# Patient Record
Sex: Male | Born: 1980 | Race: White | Hispanic: No | Marital: Married | Smoking: Never smoker
Health system: Southern US, Community
[De-identification: ages and names within clinical notes are randomized; demographics above are authoritative.]

## PROBLEM LIST (undated history)

## (undated) DIAGNOSIS — E785 Hyperlipidemia, unspecified: Secondary | ICD-10-CM

## (undated) DIAGNOSIS — I10 Essential (primary) hypertension: Secondary | ICD-10-CM

## (undated) DIAGNOSIS — F419 Anxiety disorder, unspecified: Secondary | ICD-10-CM

## (undated) HISTORY — PX: WISDOM TOOTH EXTRACTION: SHX21

---

## 2010-09-20 ENCOUNTER — Ambulatory Visit
Admission: RE | Admit: 2010-09-20 | Discharge: 2010-09-20 | Disposition: A | Discharge status: Home / Self Care | Payer: No Typology Code available for payment source | Source: Ambulatory Visit | Attending: Occupational Medicine | Admitting: Occupational Medicine

## 2010-09-20 ENCOUNTER — Other Ambulatory Visit: Payer: Self-pay | Admitting: Occupational Medicine

## 2010-09-20 DIAGNOSIS — Z0000 Encounter for general adult medical examination without abnormal findings: Secondary | ICD-10-CM

## 2013-07-09 ENCOUNTER — Encounter: Payer: Self-pay | Admitting: Family Medicine

## 2013-07-09 ENCOUNTER — Ambulatory Visit (INDEPENDENT_AMBULATORY_CARE_PROVIDER_SITE_OTHER): Payer: 59 | Admitting: Family Medicine

## 2013-07-09 VITALS — BP 132/80 | Ht 70.0 in | Wt 199.0 lb

## 2013-07-09 DIAGNOSIS — Z0000 Encounter for general adult medical examination without abnormal findings: Secondary | ICD-10-CM

## 2013-07-09 DIAGNOSIS — Z23 Encounter for immunization: Secondary | ICD-10-CM

## 2013-07-09 NOTE — Progress Notes (Signed)
   Subjective:    Patient ID: Andre Ramirez, male    DOB: [birth year], 33 y.o.   MRN: [id number]  HPI Patient is here today to get a Tdap. He recently had a baby girl 2 weeks ago.  Pt had a tetanus with diptheria in 2012.  He states he does not get allergy issues. He wonders if he should get it T. gap at this point I told him it would be a good idea because of him having a young child. He might get some increased soreness related to getting a T. gap since he just had a tetanus shot 3 years ago but it should not be any serious trouble with it. No concerns Patient denies smoking drinking denies hospitalization no chronic health problems does not take any medicines he does try to take good care of himself he works in [employer]   Review of Systems He denies headaches chest pain shortness of breath denies any other health problems.    Objective:   Physical Exam Lungs are clear hearts regular pulse normal extremities no edema      Assessment & Plan:  Overall doing well patient does good job thing care of himself he feels he is up-to-date on his cholesterol. We will go ahead and do a T. dad. Patient without any other complications currently. Safety measures dietary measures reviewed patient already exercises on a regular basis

## 2013-08-30 ENCOUNTER — Telehealth: Payer: Self-pay | Admitting: Family Medicine

## 2013-08-30 NOTE — Telephone Encounter (Signed)
Patient dropped off form asking if he is physically fit to complete a physical fitness test to be signed by physician. He wants to know if we will be able to sign this for him. I told him we would call him back probably Monday to let him know if we are going to require an appointment.

## 2013-09-01 NOTE — Telephone Encounter (Signed)
This would require an office visit. Essentially this is us giving him clearance to do significant physical activity. At the minimum I need to see him listen to his heart and lungs and check his blood pressure and gather history from the patient. I can see him this week preferably Monday Tuesday or Wednesday

## 2013-09-02 NOTE — Telephone Encounter (Signed)
Pt states he had a phyiscal a couple of months ago. Last office visit 07/09/13. Was not listed as a physical but the note states he was here for a tdap and his heart and lungs normal. Does he need another visit.

## 2013-09-02 NOTE — Telephone Encounter (Signed)
[facility]

## 2013-09-02 NOTE — Telephone Encounter (Signed)
Notified patient form will be ready tomorrow morning.

## 2013-09-02 NOTE — Telephone Encounter (Signed)
No, I can fill out his form. The patient may pick it up by tomorrow morning

## 2017-07-17 ENCOUNTER — Ambulatory Visit: Payer: Federal, State, Local not specified - PPO | Admitting: Family Medicine

## 2017-07-17 VITALS — BP 124/86 | Ht 70.0 in | Wt 201.6 lb

## 2017-07-17 DIAGNOSIS — F411 Generalized anxiety disorder: Secondary | ICD-10-CM | POA: Diagnosis not present

## 2017-07-17 DIAGNOSIS — I10 Essential (primary) hypertension: Secondary | ICD-10-CM

## 2017-07-17 MED ORDER — SERTRALINE HCL 50 MG PO TABS: 50.0000 mg | ORAL_TABLET | Freq: Every day | ORAL | 3 refills | Status: DC

## 2017-07-17 NOTE — Patient Instructions (Signed)
DASH Eating Plan DASH stands for "Dietary Approaches to Stop Hypertension." The DASH eating plan is a healthy eating plan that has been shown to reduce high blood pressure (hypertension). It may also reduce your risk for type 2 diabetes, heart disease, and stroke. The DASH eating plan may also help with weight loss. What are tips for following this plan? General guidelines  Avoid eating more than 2,300 mg (milligrams) of salt (sodium) a day. If you have hypertension, you may need to reduce your sodium intake to 1,500 mg a day.  Limit alcohol intake to no more than 1 drink a day for nonpregnant women and 2 drinks a day for men. One drink equals 12 oz of beer, 5 oz of wine, or 1 oz of hard liquor.  Work with your health care provider to maintain a healthy body weight or to lose weight. Ask what an ideal weight is for you.  Get at least 30 minutes of exercise that causes your heart to beat faster (aerobic exercise) most days of the week. Activities may include walking, swimming, or biking.  Work with your health care provider or diet and nutrition specialist (dietitian) to adjust your eating plan to your individual calorie needs. Reading food labels  Check food labels for the amount of sodium per serving. Choose foods with less than 5 percent of the Daily Value of sodium. Generally, foods with less than 300 mg of sodium per serving fit into this eating plan.  To find whole grains, look for the word "whole" as the first word in the ingredient list. Shopping  Buy products labeled as "low-sodium" or "no salt added."  Buy fresh foods. Avoid canned foods and premade or frozen meals. Cooking  Avoid adding salt when cooking. Use salt-free seasonings or herbs instead of table salt or sea salt. Check with your health care provider or pharmacist before using salt substitutes.  Do not fry foods. Cook foods using healthy methods such as baking, boiling, grilling, and broiling instead.  Cook with  heart-healthy oils, such as olive, canola, soybean, or sunflower oil. Meal planning   Eat a balanced diet that includes: ? 5 or more servings of fruits and vegetables each day. At each meal, try to fill half of your plate with fruits and vegetables. ? Up to 6-8 servings of whole grains each day. ? Less than 6 oz of lean meat, poultry, or fish each day. A 3-oz serving of meat is about the same size as a deck of cards. One egg equals 1 oz. ? 2 servings of low-fat dairy each day. ? A serving of nuts, seeds, or beans 5 times each week. ? Heart-healthy fats. Healthy fats called Omega-3 fatty acids are found in foods such as flaxseeds and coldwater fish, like sardines, salmon, and mackerel.  Limit how much you eat of the following: ? Canned or prepackaged foods. ? Food that is high in trans fat, such as fried foods. ? Food that is high in saturated fat, such as fatty meat. ? Sweets, desserts, sugary drinks, and other foods with added sugar. ? Full-fat dairy products.  Do not salt foods before eating.  Try to eat at least 2 vegetarian meals each week.  Eat more home-cooked food and less restaurant, buffet, and fast food.  When eating at a restaurant, ask that your food be prepared with less salt or no salt, if possible. What foods are recommended? The items listed may not be a complete list. Talk with your dietitian about what   dietary choices are best for you. Grains Whole-grain or whole-wheat bread. Whole-grain or whole-wheat pasta. Brown rice. Oatmeal. Quinoa. Bulgur. Whole-grain and low-sodium cereals. Pita bread. Low-fat, low-sodium crackers. Whole-wheat flour tortillas. Vegetables Fresh or frozen vegetables (raw, steamed, roasted, or grilled). Low-sodium or reduced-sodium tomato and vegetable juice. Low-sodium or reduced-sodium tomato sauce and tomato paste. Low-sodium or reduced-sodium canned vegetables. Fruits All fresh, dried, or frozen fruit. Canned fruit in natural juice (without  added sugar). Meat and other protein foods Skinless chicken or turkey. Ground chicken or turkey. Pork with fat trimmed off. Fish and seafood. Egg whites. Dried beans, peas, or lentils. Unsalted nuts, nut butters, and seeds. Unsalted canned beans. Lean cuts of beef with fat trimmed off. Low-sodium, lean deli meat. Dairy Low-fat (1%) or fat-free (skim) milk. Fat-free, low-fat, or reduced-fat cheeses. Nonfat, low-sodium ricotta or cottage cheese. Low-fat or nonfat yogurt. Low-fat, low-sodium cheese. Fats and oils Soft margarine without trans fats. Vegetable oil. Low-fat, reduced-fat, or light mayonnaise and salad dressings (reduced-sodium). Canola, safflower, olive, soybean, and sunflower oils. Avocado. Seasoning and other foods Herbs. Spices. Seasoning mixes without salt. Unsalted popcorn and pretzels. Fat-free sweets. What foods are not recommended? The items listed may not be a complete list. Talk with your dietitian about what dietary choices are best for you. Grains Baked goods made with fat, such as croissants, muffins, or some breads. Dry pasta or rice meal packs. Vegetables Creamed or fried vegetables. Vegetables in a cheese sauce. Regular canned vegetables (not low-sodium or reduced-sodium). Regular canned tomato sauce and paste (not low-sodium or reduced-sodium). Regular tomato and vegetable juice (not low-sodium or reduced-sodium). Pickles. Olives. Fruits Canned fruit in a light or heavy syrup. Fried fruit. Fruit in cream or butter sauce. Meat and other protein foods Fatty cuts of meat. Ribs. Fried meat. Bacon. Sausage. Bologna and other processed lunch meats. Salami. Fatback. Hotdogs. Bratwurst. Salted nuts and seeds. Canned beans with added salt. Canned or smoked fish. Whole eggs or egg yolks. Chicken or turkey with skin. Dairy Whole or 2% milk, cream, and half-and-half. Whole or full-fat cream cheese. Whole-fat or sweetened yogurt. Full-fat cheese. Nondairy creamers. Whipped toppings.  Processed cheese and cheese spreads. Fats and oils Butter. Stick margarine. Lard. Shortening. Ghee. Bacon fat. Tropical oils, such as coconut, palm kernel, or palm oil. Seasoning and other foods Salted popcorn and pretzels. Onion salt, garlic salt, seasoned salt, table salt, and sea salt. Worcestershire sauce. Tartar sauce. Barbecue sauce. Teriyaki sauce. Soy sauce, including reduced-sodium. Steak sauce. Canned and packaged gravies. Fish sauce. Oyster sauce. Cocktail sauce. Horseradish that you find on the shelf. Ketchup. Mustard. Meat flavorings and tenderizers. Bouillon cubes. Hot sauce and Tabasco sauce. Premade or packaged marinades. Premade or packaged taco seasonings. Relishes. Regular salad dressings. Where to find more information:  National Heart, Lung, and Blood Institute: www.nhlbi.nih.gov  American Heart Association: www.heart.org Summary  The DASH eating plan is a healthy eating plan that has been shown to reduce high blood pressure (hypertension). It may also reduce your risk for type 2 diabetes, heart disease, and stroke.  With the DASH eating plan, you should limit salt (sodium) intake to 2,300 mg a day. If you have hypertension, you may need to reduce your sodium intake to 1,500 mg a day.  When on the DASH eating plan, aim to eat more fresh fruits and vegetables, whole grains, lean proteins, low-fat dairy, and heart-healthy fats.  Work with your health care provider or diet and nutrition specialist (dietitian) to adjust your eating plan to your individual   calorie needs. This information is not intended to replace advice given to you by your health care provider. Make sure you discuss any questions you have with your health care provider. Document Released: 04/07/2011 Document Revised: 04/11/2016 Document Reviewed: 04/11/2016 Elsevier Interactive Patient Education  2018 Elsevier Inc.  

## 2017-07-17 NOTE — Progress Notes (Signed)
Subjective:    Patient ID: Andre Ramirez, male    DOB: 03-27-1981, 37 y.o.   MRN: [id number]  Anxiety  Presents for initial visit. Onset was 1 to 4 weeks ago. Symptoms include nervous/anxious behavior. Patient reports no chest pain, confusion, nausea or shortness of breath. Primary symptoms comment: sweaty hands/feet; scared of fainting again. job related . The quality of sleep is good.   Risk factors include prior traumatic experience.  Patient denies any depression symptoms States overall health good Does running at least 4 days a week lift weights several days a week eats healthy Denies being suicidal Patient currently working in [place] Was working in [employer] living up in [place] but will be moving down to join him soon Patient relates since having a traumatic event at a urgent care center in [facility] he has had significant anxiety issues patient relates he had a viral-like illness he was evaluated  They thought he had sepsis he was doing the respiratory exam he hyperventilated passed out and then the urgent care center called an ambulance he went and had extensive testing at the ER was released but ever since then he has been having anxiety spells anxiousness sweating spells he states it was a very negative experience moderately traumatic as well  Traumatic experience at urgent care in [place]. Job related stress also. No drugs or alcohol use.   Review of Systems  Constitutional: Negative for activity change.  HENT: Negative for congestion and rhinorrhea.   Respiratory: Negative for cough and shortness of breath.   Cardiovascular: Negative for chest pain.  Gastrointestinal: Negative for abdominal pain, diarrhea, nausea and vomiting.  Genitourinary: Negative for dysuria and hematuria.  Neurological: Negative for weakness and headaches.  Psychiatric/Behavioral: Negative for confusion. The patient is nervous/anxious.        Objective:   Physical Exam    Constitutional: He appears well-nourished. No distress.  HENT:  Head: Normocephalic and atraumatic.  Eyes: Right eye exhibits no discharge. Left eye exhibits no discharge.  Neck: No tracheal deviation present.  Cardiovascular: Normal rate, regular rhythm and normal heart sounds.  No murmur heard. Pulmonary/Chest: Effort normal and breath sounds normal. No respiratory distress. He has no wheezes.  Musculoskeletal: He exhibits no edema.  Lymphadenopathy:    He has no cervical adenopathy.  Neurological: He is alert.  Skin: Skin is warm. No rash noted.  Psychiatric: His behavior is normal.  Vitals reviewed.  Approximately 35-40 minutes was spent with the patient discussing his traumatic episode discussing his symptomatology the testing that they did up in [place] plus also his anxiety related symptoms he has a lot of worry and concern and anxiousness and almost panic-like sensation therefore would benefit from further intervention       Assessment & Plan:  Hypertension good control continue current medications follow-up in 6 months  Generalized anxiety disorder Long discussion held with patient given his options of counseling versus medication and counseling patient not suicidal patient opts for counseling and medication we did discuss Zoloft and side effects of Zoloft and he is perfectly fine with trying this we will start off with 50 mg daily for the first several weeks he will give [place] feedback how he is doing then we will decide if we need to increase a dose of the medicine if he has side effects of negative portion he can let us know and we will have to switch to a different medication or different approach to avoid benzodiazepine  Patient will  do a follow-up visit within a couple weeks plus he will also communicate to us through my chart as necessary or call as necessary

## 2017-07-18 ENCOUNTER — Encounter: Payer: Self-pay | Admitting: Family Medicine

## 2017-07-24 ENCOUNTER — Encounter: Payer: Self-pay | Admitting: Family Medicine

## 2017-07-24 NOTE — Telephone Encounter (Signed)
Nurses-I agree patient needs to stay off of Zoloft. Also please assist referral to [facility] [place]- if pt needs follow up with me next week that would be fine but what he needs most is psychiatry consult- please make sure that Brendal and yall send this as a urgent consult and he neesd more than counseling he needs psycjhiatry consult for medications (also if pt suicidal emergency consult behav eval via ER)

## 2017-07-25 ENCOUNTER — Other Ambulatory Visit: Payer: Self-pay

## 2017-07-25 DIAGNOSIS — F411 Generalized anxiety disorder: Secondary | ICD-10-CM

## 2017-07-25 NOTE — Telephone Encounter (Signed)
-  Crystal-Thank you for your ongoing assistance.  Please discuss with Enid DerryBrendale if doing the appointment through [facility] would speed things up?  If so please give the patient the proper phone number for [facility] to see if they can see him sooner

## 2017-07-25 NOTE — Telephone Encounter (Signed)
Patient is aware of all. I called Behavioral health and spoke with Lupita LeashDonna they state they will have to review the patient's records(records were sent to them) and will call pt with an appointment. Which may be the end of May as Dr.Hisada will be out of town and she is the only one whom sees adults.(She does not have anyone whom covers for her) I let the pt know this and he is aware that if he needs anything to call our office in the mean time.Also if gets desperate to go to the Ed.

## 2017-07-25 NOTE — Telephone Encounter (Signed)
Nurses-please see addendum

## 2017-07-26 NOTE — Telephone Encounter (Signed)
I called the [facility] and spoke with Rosey Batheresa she states they did not have any available appt until May 10,2019. They have put him down for this appt and placed on a wait list. I called the pt to notify of this and he states he will be out of town starting next week until July 2019. I reitterated what I told him yesterday if needs anyting for now call us or go to the ed for evaluation if desparate.I called the [facility] and left a message on the vm to cancel the appt,asked that they r/c here to the office.

## 2017-07-31 ENCOUNTER — Encounter: Payer: Self-pay | Admitting: Family Medicine

## 2017-07-31 ENCOUNTER — Ambulatory Visit: Payer: Federal, State, Local not specified - PPO | Admitting: Family Medicine

## 2017-07-31 VITALS — BP 118/68 | Ht 70.0 in | Wt 198.0 lb

## 2017-07-31 DIAGNOSIS — F439 Reaction to severe stress, unspecified: Secondary | ICD-10-CM

## 2017-07-31 DIAGNOSIS — I10 Essential (primary) hypertension: Secondary | ICD-10-CM

## 2017-07-31 DIAGNOSIS — F411 Generalized anxiety disorder: Secondary | ICD-10-CM | POA: Diagnosis not present

## 2017-07-31 MED ORDER — LISINOPRIL 10 MG PO TABS: 10.0000 mg | ORAL_TABLET | Freq: Every day | ORAL | 1 refills | Status: DC

## 2017-07-31 NOTE — Progress Notes (Signed)
   Subjective:    Patient ID: Andre Ramirez, male    DOB: 1981/01/10, 37 y.o.   MRN: [id number]  HPI Patient is here today to follow up on Htn.He is taking Lisinopril 10 mg daily. Patient for blood pressure check up. Patient relates compliance with meds. Todays BP reviewed with the patient. Patient denies issues with medication. Patient relates reasonable diet. Patient tries to minimize salt. Patient aware of BP goals.    He states he was started on Zoloft 50 mg was having drowiness, diarrhea, more anxious.He stopped it on his own.Since off of it he states he feels better. Patient states he had severe side effects with this made him feel worse.  He did stop the medicine he is now starting to feel back like his usual self Still has some anxiety related issues he states he is putting them into perspective  Patient went through a very traumatic episode at a urgent care center and was sent to the ER for further workup which was negative  Review of Systems  Constitutional: Negative for activity change.  HENT: Negative for congestion and rhinorrhea.   Respiratory: Negative for cough and shortness of breath.   Cardiovascular: Negative for chest pain.  Gastrointestinal: Negative for abdominal pain, diarrhea, nausea and vomiting.  Genitourinary: Negative for dysuria and hematuria.  Neurological: Negative for weakness and headaches.  Psychiatric/Behavioral: Negative for confusion.       Objective:   Physical Exam  Constitutional: He appears well-nourished. No distress.  HENT:  Head: Normocephalic and atraumatic.  Eyes: Right eye exhibits no discharge. Left eye exhibits no discharge.  Neck: No tracheal deviation present.  Cardiovascular: Normal rate, regular rhythm and normal heart sounds.  No murmur heard. Pulmonary/Chest: Effort normal and breath sounds normal. No respiratory distress. He has no wheezes.  Musculoskeletal: He exhibits no edema.  Lymphadenopathy:    He has no cervical  adenopathy.  Neurological: He is alert.  Skin: Skin is warm. No rash noted.  Psychiatric: His behavior is normal.  Vitals reviewed.         Assessment & Plan:  Patient did sign for records to be sent I have not received them at this point  Blood pressure good control continue current measures  Patient under a little bit of stress because his family is not with him currently but hopefully after his training they will be able to be with him  Moderate anxiety symptoms not clinically depressed no need for medication recommend treatment with mindfulness and meditation follow-up if ongoing troubles patient unfortunately cannot be seen by psychology for several weeks He will be in [place] for the next 4 months for training with [employer] he will send us an update in several weeks time how he is doing

## 2017-09-04 ENCOUNTER — Encounter: Payer: Self-pay | Admitting: Family Medicine

## 2017-09-05 ENCOUNTER — Telehealth: Payer: Self-pay | Admitting: Family Medicine

## 2017-09-05 NOTE — Telephone Encounter (Signed)
Velma from [facility] returned call and was informed to cancel patients appt.

## 2017-09-05 NOTE — Telephone Encounter (Signed)
I have called and left a message on vm [phone] to r/c to our office as I needed to cx and appointment for a pt.

## 2017-09-05 NOTE — Telephone Encounter (Signed)
Please cancel his appointment with behavioral health.  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  No need to reschedule at this point.

## 2017-09-08 ENCOUNTER — Encounter

## 2017-09-08 ENCOUNTER — Ambulatory Visit (HOSPITAL_COMMUNITY): Payer: Self-pay | Admitting: Psychiatry

## 2017-09-14 ENCOUNTER — Telehealth: Payer: Self-pay | Admitting: Family Medicine

## 2017-09-14 NOTE — Telephone Encounter (Signed)
This patient is currently in [place].  He is in training.  Please let the patient know that I received copies of tests which were completed in [place] when he was in [place].  Overall all of this looks good.  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  This is not worrisome but will need follow-up.  In many cases this finding may not be accurate so therefore often we have to do a follow-up ultrasound.  No need to do any testing right at this moment.  Therefore I recommend that he do a follow-up later this summer.  It is not necessary for this patient to stop his training.  This is not an emergent issue.  But it does need follow-up so therefore I want to make sure he is aware to do a follow-up office visit after his training later this summer.  We will discuss this further at that time.  Eating healthy and staying with regular physical activity is all he needs to do.  If the patient is worried about this we will be happy to see him sooner if he is not in [place].

## 2017-09-15 NOTE — Telephone Encounter (Signed)
Left message to return call 

## 2017-09-15 NOTE — Telephone Encounter (Signed)
Discussed with pt. Pt verbalized understanding. He states he will call back to schedule the follow up end of summer with dr scott.

## 2017-12-11 ENCOUNTER — Telehealth: Payer: Self-pay | Admitting: Family Medicine

## 2017-12-11 NOTE — Telephone Encounter (Signed)
Last labs scanned in Met 7, CBC and TSH 05/2017

## 2017-12-11 NOTE — Telephone Encounter (Signed)
Patient has wellness on 9/11 and requesting labs he wanted to know if he had to fast also.

## 2017-12-11 NOTE — Telephone Encounter (Signed)
I recommend metabolic 7, lipid, liver Also Centers for Disease Control recommends that all adults do a one-time HIV antibody test as screening purposes

## 2017-12-12 ENCOUNTER — Other Ambulatory Visit: Payer: Self-pay | Admitting: Family Medicine

## 2017-12-12 DIAGNOSIS — Z114 Encounter for screening for human immunodeficiency virus [HIV]: Secondary | ICD-10-CM

## 2017-12-12 DIAGNOSIS — Z79899 Other long term (current) drug therapy: Secondary | ICD-10-CM

## 2017-12-12 DIAGNOSIS — Z0000 Encounter for general adult medical examination without abnormal findings: Secondary | ICD-10-CM

## 2017-12-12 DIAGNOSIS — Z13220 Encounter for screening for lipoid disorders: Secondary | ICD-10-CM

## 2017-12-12 DIAGNOSIS — I10 Essential (primary) hypertension: Secondary | ICD-10-CM

## 2017-12-12 NOTE — Telephone Encounter (Signed)
Labs ordered and pt notified

## 2018-01-04 ENCOUNTER — Encounter: Payer: Self-pay | Admitting: Family Medicine

## 2018-01-05 DIAGNOSIS — Z79899 Other long term (current) drug therapy: Secondary | ICD-10-CM | POA: Diagnosis not present

## 2018-01-05 DIAGNOSIS — Z0000 Encounter for general adult medical examination without abnormal findings: Secondary | ICD-10-CM | POA: Diagnosis not present

## 2018-01-05 DIAGNOSIS — I10 Essential (primary) hypertension: Secondary | ICD-10-CM | POA: Diagnosis not present

## 2018-01-05 DIAGNOSIS — Z13220 Encounter for screening for lipoid disorders: Secondary | ICD-10-CM | POA: Diagnosis not present

## 2018-01-05 DIAGNOSIS — Z114 Encounter for screening for human immunodeficiency virus [HIV]: Secondary | ICD-10-CM | POA: Diagnosis not present

## 2018-01-06 LAB — BASIC METABOLIC PANEL
BUN/Creatinine Ratio: 12 (ref 9–20)
BUN: 15 mg/dL (ref 6–20)
CALCIUM: 9.7 mg/dL (ref 8.7–10.2)
CO2: 19 mmol/L — AB (ref 20–29)
CREATININE: 1.23 mg/dL (ref 0.76–1.27)
Chloride: 103 mmol/L (ref 96–106)
GFR, EST AFRICAN AMERICAN: 86 mL/min/{1.73_m2} (ref >59)
GFR, EST NON AFRICAN AMERICAN: 75 mL/min/{1.73_m2} (ref >59)
Glucose: 91 mg/dL (ref 65–99)
Potassium: 4.3 mmol/L (ref 3.5–5.2)
Sodium: 141 mmol/L (ref 134–144)

## 2018-01-06 LAB — LIPID PANEL
CHOL/HDL RATIO: 2.9 ratio (ref 0.0–5.0)
Cholesterol, Total: 174 mg/dL (ref 100–199)
HDL: 60 mg/dL (ref >39)
LDL Calculated: 103 mg/dL — ABNORMAL HIGH (ref 0–99)
Triglycerides: 54 mg/dL (ref 0–149)
VLDL CHOLESTEROL CAL: 11 mg/dL (ref 5–40)

## 2018-01-06 LAB — HEPATIC FUNCTION PANEL
ALBUMIN: 5.1 g/dL (ref 3.5–5.5)
ALT: 31 IU/L (ref 0–44)
AST: 24 IU/L (ref 0–40)
Alkaline Phosphatase: 72 IU/L (ref 39–117)
BILIRUBIN TOTAL: 0.5 mg/dL (ref 0.0–1.2)
BILIRUBIN, DIRECT: 0.14 mg/dL (ref 0.00–0.40)
Total Protein: 7.2 g/dL (ref 6.0–8.5)

## 2018-01-06 LAB — HIV ANTIBODY (ROUTINE TESTING W REFLEX): HIV Screen 4th Generation wRfx: NONREACTIVE

## 2018-01-10 ENCOUNTER — Encounter: Payer: Self-pay | Admitting: Family Medicine

## 2018-01-10 ENCOUNTER — Ambulatory Visit (INDEPENDENT_AMBULATORY_CARE_PROVIDER_SITE_OTHER): Payer: Federal, State, Local not specified - PPO | Admitting: Family Medicine

## 2018-01-10 VITALS — BP 134/86 | HR 88 | Ht 70.0 in | Wt 209.0 lb

## 2018-01-10 DIAGNOSIS — Z0000 Encounter for general adult medical examination without abnormal findings: Secondary | ICD-10-CM | POA: Diagnosis not present

## 2018-01-10 DIAGNOSIS — I6529 Occlusion and stenosis of unspecified carotid artery: Secondary | ICD-10-CM

## 2018-01-10 DIAGNOSIS — I10 Essential (primary) hypertension: Secondary | ICD-10-CM

## 2018-01-10 MED ORDER — LISINOPRIL 20 MG PO TABS: 20.0000 mg | ORAL_TABLET | Freq: Every day | ORAL | 1 refills | Status: DC

## 2018-01-10 NOTE — Progress Notes (Signed)
   Subjective:    Patient ID: Andre Ramirez, male    DOB: January 04, 1981, 37 y.o.   MRN: [id number]  HPI The patient comes in today for a wellness visit.    A review of their health history was completed.  A review of medications was also completed.  Any needed refills; lisinopril  Eating habits: health conscious  Falls/  MVA accidents in past few months: none  Regular exercise: yes running and weight lifting  Specialist pt sees on regular basis: none  Preventative health issues were discussed.   Additional concerns: none  Patient does exercise does not smoke drinks occasionally denies high fevers health issues headaches.  Denies chest pain.  Blood pressure at times runs up but I am not sure the accuracy of his blood pressure cuff  Review of Systems  Constitutional: Negative for diaphoresis and fatigue.  HENT: Negative for congestion and rhinorrhea.   Respiratory: Negative for cough and shortness of breath.   Cardiovascular: Negative for chest pain and leg swelling.  Gastrointestinal: Negative for abdominal pain and diarrhea.  Skin: Negative for color change and rash.  Neurological: Negative for dizziness and headaches.  Psychiatric/Behavioral: Negative for behavioral problems and confusion.       Objective:   Physical Exam  Constitutional: He appears well-nourished. No distress.  HENT:  Head: Normocephalic and atraumatic.  Eyes: Right eye exhibits no discharge. Left eye exhibits no discharge.  Neck: No tracheal deviation present.  Cardiovascular: Normal rate, regular rhythm and normal heart sounds.  No murmur heard. Pulmonary/Chest: Effort normal and breath sounds normal. No respiratory distress.  Musculoskeletal: He exhibits no edema.  Lymphadenopathy:    He has no cervical adenopathy.  Neurological: He is alert. Coordination normal.  Skin: Skin is warm and dry.  Psychiatric: He has a normal mood and affect. His behavior is normal.  Vitals reviewed.   Blood  pressure checked multiple times under fair control l-patient is a young patient would benefit from having blood pressure low or we will double up on the lisinopril patient should follow-up with him 4 to 6 weeks bring his blood pressure cuff eat healthy diet stay physically active goal is to get blood pressure approximately 130/80 if possible patient does get stressed about blood pressure checks it too frequently I encouraged him to back off on the frequency of checking it.     Assessment & Plan:  Very nice gentleman  Adult wellness-complete.wellness physical was conducted today. Importance of diet and exercise were discussed in detail.  In addition to this a discussion regarding safety was also covered. We also reviewed over immunizations and gave recommendations regarding current immunization needed for age.  In addition to this additional areas were also touched on including: Preventative health exams needed:  Colonoscopy not indicated  Patient was advised yearly wellness exam  Patient on ultrasound from earlier this spring in [place] had a test that showed 50% carotid blockage I believe that this is a false abnormal result but I did also believe that the patient needs to have another carotid ultrasound to verify it I am not certain if they will allow [place] to do the ultrasound now or if the insurance will make his weight 1 year-we will look into this

## 2018-01-10 NOTE — Patient Instructions (Signed)
DASH Eating Plan DASH stands for "Dietary Approaches to Stop Hypertension." The DASH eating plan is a healthy eating plan that has been shown to reduce high blood pressure (hypertension). It may also reduce your risk for type 2 diabetes, heart disease, and stroke. The DASH eating plan may also help with weight loss. What are tips for following this plan? General guidelines  Avoid eating more than 2,300 mg (milligrams) of salt (sodium) a day. If you have hypertension, you may need to reduce your sodium intake to 1,500 mg a day.  Limit alcohol intake to no more than 1 drink a day for nonpregnant women and 2 drinks a day for men. One drink equals 12 oz of beer, 5 oz of wine, or 1 oz of hard liquor.  Work with your health care provider to maintain a healthy body weight or to lose weight. Ask what an ideal weight is for you.  Get at least 30 minutes of exercise that causes your heart to beat faster (aerobic exercise) most days of the week. Activities may include walking, swimming, or biking.  Work with your health care provider or diet and nutrition specialist (dietitian) to adjust your eating plan to your individual calorie needs. Reading food labels  Check food labels for the amount of sodium per serving. Choose foods with less than 5 percent of the Daily Value of sodium. Generally, foods with less than 300 mg of sodium per serving fit into this eating plan.  To find whole grains, look for the word "whole" as the first word in the ingredient list. Shopping  Buy products labeled as "low-sodium" or "no salt added."  Buy fresh foods. Avoid canned foods and premade or frozen meals. Cooking  Avoid adding salt when cooking. Use salt-free seasonings or herbs instead of table salt or sea salt. Check with your health care provider or pharmacist before using salt substitutes.  Do not fry foods. Cook foods using healthy methods such as baking, boiling, grilling, and broiling instead.  Cook with  heart-healthy oils, such as olive, canola, soybean, or sunflower oil. Meal planning   Eat a balanced diet that includes: ? 5 or more servings of fruits and vegetables each day. At each meal, try to fill half of your plate with fruits and vegetables. ? Up to 6-8 servings of whole grains each day. ? Less than 6 oz of lean meat, poultry, or fish each day. A 3-oz serving of meat is about the same size as a deck of cards. One egg equals 1 oz. ? 2 servings of low-fat dairy each day. ? A serving of nuts, seeds, or beans 5 times each week. ? Heart-healthy fats. Healthy fats called Omega-3 fatty acids are found in foods such as flaxseeds and coldwater fish, like sardines, salmon, and mackerel.  Limit how much you eat of the following: ? Canned or prepackaged foods. ? Food that is high in trans fat, such as fried foods. ? Food that is high in saturated fat, such as fatty meat. ? Sweets, desserts, sugary drinks, and other foods with added sugar. ? Full-fat dairy products.  Do not salt foods before eating.  Try to eat at least 2 vegetarian meals each week.  Eat more home-cooked food and less restaurant, buffet, and fast food.  When eating at a restaurant, ask that your food be prepared with less salt or no salt, if possible. What foods are recommended? The items listed may not be a complete list. Talk with your dietitian about what   dietary choices are best for you. Grains Whole-grain or whole-wheat bread. Whole-grain or whole-wheat pasta. Brown rice. Oatmeal. Quinoa. Bulgur. Whole-grain and low-sodium cereals. Pita bread. Low-fat, low-sodium crackers. Whole-wheat flour tortillas. Vegetables Fresh or frozen vegetables (raw, steamed, roasted, or grilled). Low-sodium or reduced-sodium tomato and vegetable juice. Low-sodium or reduced-sodium tomato sauce and tomato paste. Low-sodium or reduced-sodium canned vegetables. Fruits All fresh, dried, or frozen fruit. Canned fruit in natural juice (without  added sugar). Meat and other protein foods Skinless chicken or turkey. Ground chicken or turkey. Pork with fat trimmed off. Fish and seafood. Egg whites. Dried beans, peas, or lentils. Unsalted nuts, nut butters, and seeds. Unsalted canned beans. Lean cuts of beef with fat trimmed off. Low-sodium, lean deli meat. Dairy Low-fat (1%) or fat-free (skim) milk. Fat-free, low-fat, or reduced-fat cheeses. Nonfat, low-sodium ricotta or cottage cheese. Low-fat or nonfat yogurt. Low-fat, low-sodium cheese. Fats and oils Soft margarine without trans fats. Vegetable oil. Low-fat, reduced-fat, or light mayonnaise and salad dressings (reduced-sodium). Canola, safflower, olive, soybean, and sunflower oils. Avocado. Seasoning and other foods Herbs. Spices. Seasoning mixes without salt. Unsalted popcorn and pretzels. Fat-free sweets. What foods are not recommended? The items listed may not be a complete list. Talk with your dietitian about what dietary choices are best for you. Grains Baked goods made with fat, such as croissants, muffins, or some breads. Dry pasta or rice meal packs. Vegetables Creamed or fried vegetables. Vegetables in a cheese sauce. Regular canned vegetables (not low-sodium or reduced-sodium). Regular canned tomato sauce and paste (not low-sodium or reduced-sodium). Regular tomato and vegetable juice (not low-sodium or reduced-sodium). Pickles. Olives. Fruits Canned fruit in a light or heavy syrup. Fried fruit. Fruit in cream or butter sauce. Meat and other protein foods Fatty cuts of meat. Ribs. Fried meat. Bacon. Sausage. Bologna and other processed lunch meats. Salami. Fatback. Hotdogs. Bratwurst. Salted nuts and seeds. Canned beans with added salt. Canned or smoked fish. Whole eggs or egg yolks. Chicken or turkey with skin. Dairy Whole or 2% milk, cream, and half-and-half. Whole or full-fat cream cheese. Whole-fat or sweetened yogurt. Full-fat cheese. Nondairy creamers. Whipped toppings.  Processed cheese and cheese spreads. Fats and oils Butter. Stick margarine. Lard. Shortening. Ghee. Bacon fat. Tropical oils, such as coconut, palm kernel, or palm oil. Seasoning and other foods Salted popcorn and pretzels. Onion salt, garlic salt, seasoned salt, table salt, and sea salt. Worcestershire sauce. Tartar sauce. Barbecue sauce. Teriyaki sauce. Soy sauce, including reduced-sodium. Steak sauce. Canned and packaged gravies. Fish sauce. Oyster sauce. Cocktail sauce. Horseradish that you find on the shelf. Ketchup. Mustard. Meat flavorings and tenderizers. Bouillon cubes. Hot sauce and Tabasco sauce. Premade or packaged marinades. Premade or packaged taco seasonings. Relishes. Regular salad dressings. Where to find more information:  National Heart, Lung, and Blood Institute: www.nhlbi.nih.gov  American Heart Association: www.heart.org Summary  The DASH eating plan is a healthy eating plan that has been shown to reduce high blood pressure (hypertension). It may also reduce your risk for type 2 diabetes, heart disease, and stroke.  With the DASH eating plan, you should limit salt (sodium) intake to 2,300 mg a day. If you have hypertension, you may need to reduce your sodium intake to 1,500 mg a day.  When on the DASH eating plan, aim to eat more fresh fruits and vegetables, whole grains, lean proteins, low-fat dairy, and heart-healthy fats.  Work with your health care provider or diet and nutrition specialist (dietitian) to adjust your eating plan to your individual   calorie needs. This information is not intended to replace advice given to you by your health care provider. Make sure you discuss any questions you have with your health care provider. Document Released: 04/07/2011 Document Revised: 04/11/2016 Document Reviewed: 04/11/2016 Elsevier Interactive Patient Education  2018 Elsevier Inc.  

## 2018-02-26 ENCOUNTER — Ambulatory Visit: Payer: Federal, State, Local not specified - PPO | Admitting: Family Medicine

## 2018-02-26 ENCOUNTER — Encounter: Payer: Self-pay | Admitting: Family Medicine

## 2018-02-26 VITALS — BP 132/74 | Ht 70.0 in | Wt 208.8 lb

## 2018-02-26 DIAGNOSIS — Z23 Encounter for immunization: Secondary | ICD-10-CM

## 2018-02-26 DIAGNOSIS — I10 Essential (primary) hypertension: Secondary | ICD-10-CM

## 2018-02-26 DIAGNOSIS — I6529 Occlusion and stenosis of unspecified carotid artery: Secondary | ICD-10-CM

## 2018-02-26 NOTE — Progress Notes (Signed)
   Subjective:    Patient ID: Andre Ramirez, male    DOB: [birth year], 37 y.o.   MRN: [id number]  Hypertension  The current episode started more than 1 month ago. Pertinent negatives include no chest pain. There are no compliance problems.    Pt here today for 6 week recheck. When taking at home his BP is in the 130/80s. Pt takes his BP on his weekends mostly but sometimes he does take it during the week. Pt takes his BP after he takes his medication.    Patient does try to stay healthy he is watching diet  Patient did have ultrasound of carotids approximately 6 months ago which showed 50% blockage on the right side which is surprising it is quite possible this test had potential air it was done through urgent care center and left that was associated with a hospital in [place] Review of Systems  Constitutional: Negative for activity change, chills and fever.  HENT: Positive for congestion and rhinorrhea. Negative for ear pain.   Eyes: Negative for discharge.  Respiratory: Positive for cough. Negative for wheezing.   Cardiovascular: Negative for chest pain.  Gastrointestinal: Negative for nausea and vomiting.  Musculoskeletal: Negative for arthralgias.       Objective:   Physical Exam  Constitutional: He appears well-developed.  HENT:  Head: Normocephalic.  Mouth/Throat: Oropharynx is clear and moist. No oropharyngeal exudate.  Neck: Normal range of motion.  Cardiovascular: Normal rate, regular rhythm and normal heart sounds.  No murmur heard. Pulmonary/Chest: Effort normal and breath sounds normal. He has no wheezes.  Lymphadenopathy:    He has no cervical adenopathy.  Neurological: He exhibits normal muscle tone.  Skin: Skin is warm and dry.  Nursing note and vitals reviewed.         Assessment & Plan:  Blood pressure good control continue current measures eat healthy stay active  Follow-up in 6 months  Patient had mild buildup of carotid artery diagnosed  approximately 7 months ago unlikely to be accurate we are going to check to see if his insurance will cover for carotid ultrasound.

## 2018-03-08 ENCOUNTER — Other Ambulatory Visit: Payer: Self-pay | Admitting: Family Medicine

## 2018-03-08 ENCOUNTER — Telehealth: Payer: Self-pay | Admitting: Family Medicine

## 2018-03-08 DIAGNOSIS — I25119 Atherosclerotic heart disease of native coronary artery with unspecified angina pectoris: Secondary | ICD-10-CM

## 2018-03-08 NOTE — Telephone Encounter (Signed)
Nurses Please set up carotid ultrasound Reason for this is carotid artery disease It is not emergent this can be scheduled as per the patient's convenience and hospital schedule thank you  Please let the patient know that we did check with his insurance company and they stated that they would allow him to have another carotid ultrasound

## 2018-03-08 NOTE — Telephone Encounter (Signed)
Patient is aware 

## 2018-03-08 NOTE — Telephone Encounter (Signed)
Contacted patient. Pt states any time will be fine. [facility] hospital is fine. He would like this sooner rather than later. Ultrasound scheduled for Monday November 11 at 2:30 pm; arrive at 2:15 for registration. Left message to return call

## 2018-03-12 ENCOUNTER — Ambulatory Visit (HOSPITAL_COMMUNITY)
Admission: RE | Admit: 2018-03-12 | Discharge: 2018-03-12 | Disposition: A | Discharge status: Home / Self Care | Payer: Federal, State, Local not specified - PPO | Source: Ambulatory Visit | Attending: Family Medicine | Admitting: Family Medicine

## 2018-03-12 DIAGNOSIS — I25119 Atherosclerotic heart disease of native coronary artery with unspecified angina pectoris: Secondary | ICD-10-CM | POA: Diagnosis not present

## 2018-03-12 DIAGNOSIS — I6523 Occlusion and stenosis of bilateral carotid arteries: Secondary | ICD-10-CM | POA: Diagnosis not present

## 2018-05-17 ENCOUNTER — Encounter: Payer: Self-pay | Admitting: Family Medicine

## 2018-06-29 ENCOUNTER — Other Ambulatory Visit: Payer: Self-pay | Admitting: Family Medicine

## 2018-08-23 ENCOUNTER — Telehealth: Payer: Self-pay | Admitting: Family Medicine

## 2018-08-23 NOTE — Telephone Encounter (Signed)
Ov scott tom

## 2018-08-23 NOTE — Telephone Encounter (Signed)
Pt wife contacted and informed that an office visit tomorrow with Dr.Scott is recommended. Pt wife transferred up front to set up appt.

## 2018-08-23 NOTE — Telephone Encounter (Signed)
Pt wife contacted office. Pt has been having daily stomach issues for the past 2 weeks. Diarrhea, constipation and pain acros central middle of stomach. Pt has not had fever, vomiting, or nausea. Pt wife states he is able to eat and drink, able to exercise. Pt wife states they have cut out dairy to try to help. Pt wife states they have been eating meats that were frozen but had been thawed out. Pt wife states that diarrhea is not mucous. Please advise.thank you.

## 2018-08-24 ENCOUNTER — Encounter: Payer: Self-pay | Admitting: Family Medicine

## 2018-08-24 ENCOUNTER — Other Ambulatory Visit: Payer: Self-pay

## 2018-08-24 ENCOUNTER — Ambulatory Visit (INDEPENDENT_AMBULATORY_CARE_PROVIDER_SITE_OTHER): Payer: Federal, State, Local not specified - PPO | Admitting: Family Medicine

## 2018-08-24 VITALS — Temp 98.2°F

## 2018-08-24 DIAGNOSIS — R197 Diarrhea, unspecified: Secondary | ICD-10-CM

## 2018-08-24 DIAGNOSIS — I10 Essential (primary) hypertension: Secondary | ICD-10-CM

## 2018-08-24 MED ORDER — DICYCLOMINE HCL 10 MG PO CAPS: ORAL_CAPSULE | ORAL | 1 refills | Status: DC

## 2018-08-24 MED ORDER — LISINOPRIL 20 MG PO TABS: 20.0000 mg | ORAL_TABLET | Freq: Every day | ORAL | 1 refills | Status: DC

## 2018-08-24 NOTE — Progress Notes (Signed)
   Subjective:    Patient ID: Andre Ramirez, male    DOB: July 06, 1980, 38 y.o.   MRN: [id number]  Diarrhea   This is a new problem. The current episode started in the past 7 days. The problem occurs 2 to 4 times per day. The problem has been waxing and waning. The stool consistency is described as watery and mucous. The patient states that diarrhea awakens him from sleep. Associated symptoms include abdominal pain. Pertinent negatives include no arthralgias, chills, coughing, fever, headaches, increased  flatus, myalgias, sweats or vomiting. Nothing aggravates the symptoms. He has tried nothing for the symptoms. The treatment provided no relief. There is no history of bowel resection or inflammatory bowel disease.    Patient arrives with abdominal pain for a few weeks- had diarrhea a few weeks ago but that has resolved Tried probiotic  Tried fiber Some loose stools recently Some abd cramps Some gurgling in the abdomen No antibiotics No travel  Review of Systems  Constitutional: Negative for activity change, chills and fever.  HENT: Negative for congestion, ear pain and rhinorrhea.   Eyes: Negative for discharge.  Respiratory: Negative for cough and wheezing.   Cardiovascular: Negative for chest pain.  Gastrointestinal: Positive for abdominal pain and diarrhea. Negative for flatus, nausea and vomiting.  Musculoskeletal: Negative for arthralgias and myalgias.  Neurological: Negative for headaches.       Objective:   Physical Exam Vitals signs reviewed.  Cardiovascular:     Rate and Rhythm: Normal rate and regular rhythm.     Heart sounds: Normal heart sounds. No murmur.  Pulmonary:     Effort: Pulmonary effort is normal.     Breath sounds: Normal breath sounds.  Lymphadenopathy:     Cervical: No cervical adenopathy.  Neurological:     Mental Status: He is alert.  Psychiatric:        Behavior: Behavior normal.    Abdomen is soft no guarding rebound or tenderness        Assessment & Plan:  Intermittent loose stools as well as abdominal pain and cramping over the past 2 weeks There is no red flags that point toward a serious underlying issue It could be IBS could be viral could even be food related Patient will try conservative measures over the next 2 weeks if he has ongoing troubles we will do stool testing and GI consult for sigmoidoscopy I do not feel patient dealing with cancer  Blood pressure on recheck very good his blood pressure cuff looks good ours does well also readings are good watch diet stay active continue medication follow-up within 6 months

## 2018-08-24 NOTE — Patient Instructions (Signed)
OTC probiotic  [facility]  One day for the next few weeks  Send me a mychart update in 2 weeks

## 2018-08-28 ENCOUNTER — Ambulatory Visit: Payer: Federal, State, Local not specified - PPO | Admitting: Family Medicine

## 2018-09-05 ENCOUNTER — Encounter: Payer: Self-pay | Admitting: Family Medicine

## 2018-09-27 ENCOUNTER — Other Ambulatory Visit: Payer: Self-pay | Admitting: Family Medicine

## 2019-03-26 ENCOUNTER — Encounter: Payer: Self-pay | Admitting: Family Medicine

## 2019-03-26 MED ORDER — LISINOPRIL 20 MG PO TABS: 20.0000 mg | ORAL_TABLET | Freq: Every day | ORAL | 1 refills | Status: DC

## 2019-03-26 NOTE — Telephone Encounter (Signed)
Nurses Please go ahead with lisinopril refill Patient may have 90-day with 1 refill Please message to the patient that we can set up a wellness physical anywhere within the next 3 to 5 months months Currently we are doing mainly virtual visits to limit people's exposure to other people but we can fit in the wellness exam at his convenience. The patient may call to schedule

## 2019-03-26 NOTE — Addendum Note (Signed)
Addended by: Vicente Males on: 03/26/2019 01:42 PM   Modules accepted: Orders

## 2019-05-24 ENCOUNTER — Telehealth: Payer: Self-pay | Admitting: Family Medicine

## 2019-05-24 ENCOUNTER — Encounter: Payer: Self-pay | Admitting: Family Medicine

## 2019-05-24 DIAGNOSIS — Z13220 Encounter for screening for lipoid disorders: Secondary | ICD-10-CM

## 2019-05-24 DIAGNOSIS — Z79899 Other long term (current) drug therapy: Secondary | ICD-10-CM

## 2019-05-24 DIAGNOSIS — I10 Essential (primary) hypertension: Secondary | ICD-10-CM

## 2019-05-24 NOTE — Telephone Encounter (Signed)
Pt has CPE scheduled 2/2 and would like to have lab work done before appt.

## 2019-05-24 NOTE — Telephone Encounter (Signed)
Nurses In regards to his MyChart message Please review the lab work Please add to the lab work C-reactive protein Tissue transglutaminase IgA due to diarrhea issues thank you and then send the patient a MyChart message letting him know that it has been redone and then he can do his blood work thank you

## 2019-05-24 NOTE — Telephone Encounter (Signed)
Lipid, liver, metabolic 7, CBC 

## 2019-05-24 NOTE — Telephone Encounter (Signed)
Blood work ordered in Epic. Patient notified. 

## 2019-05-24 NOTE — Telephone Encounter (Signed)
Last labs 01/05/2018:  Lipid, Liver, Met 7 and HIV

## 2019-05-27 ENCOUNTER — Other Ambulatory Visit: Payer: Self-pay | Admitting: Family Medicine

## 2019-05-27 DIAGNOSIS — R197 Diarrhea, unspecified: Secondary | ICD-10-CM

## 2019-05-27 DIAGNOSIS — Z0000 Encounter for general adult medical examination without abnormal findings: Secondary | ICD-10-CM

## 2019-05-27 DIAGNOSIS — Z13220 Encounter for screening for lipoid disorders: Secondary | ICD-10-CM

## 2019-05-27 DIAGNOSIS — I10 Essential (primary) hypertension: Secondary | ICD-10-CM

## 2019-05-27 DIAGNOSIS — Z79899 Other long term (current) drug therapy: Secondary | ICD-10-CM

## 2019-06-04 ENCOUNTER — Encounter: Payer: Federal, State, Local not specified - PPO | Admitting: Family Medicine

## 2019-06-11 DIAGNOSIS — I10 Essential (primary) hypertension: Secondary | ICD-10-CM | POA: Diagnosis not present

## 2019-06-11 DIAGNOSIS — Z79899 Other long term (current) drug therapy: Secondary | ICD-10-CM | POA: Diagnosis not present

## 2019-06-11 DIAGNOSIS — Z13220 Encounter for screening for lipoid disorders: Secondary | ICD-10-CM | POA: Diagnosis not present

## 2019-06-11 DIAGNOSIS — R197 Diarrhea, unspecified: Secondary | ICD-10-CM | POA: Diagnosis not present

## 2019-06-12 LAB — CBC WITH DIFFERENTIAL/PLATELET
Basophils Absolute: 0 10*3/uL (ref 0.0–0.2)
Basos: 1 %
EOS (ABSOLUTE): 0.2 10*3/uL (ref 0.0–0.4)
Eos: 3 %
Hematocrit: 49.6 % (ref 37.5–51.0)
Hemoglobin: 16.9 g/dL (ref 13.0–17.7)
Immature Grans (Abs): 0 10*3/uL (ref 0.0–0.1)
Immature Granulocytes: 0 %
Lymphocytes Absolute: 2.9 10*3/uL (ref 0.7–3.1)
Lymphs: 46 %
MCH: 30.9 pg (ref 26.6–33.0)
MCHC: 34.1 g/dL (ref 31.5–35.7)
MCV: 91 fL (ref 79–97)
Monocytes Absolute: 0.5 10*3/uL (ref 0.1–0.9)
Monocytes: 9 %
Neutrophils Absolute: 2.6 10*3/uL (ref 1.4–7.0)
Neutrophils: 41 %
Platelets: 218 10*3/uL (ref 150–450)
RBC: 5.47 x10E6/uL (ref 4.14–5.80)
RDW: 12.1 % (ref 11.6–15.4)
WBC: 6.2 10*3/uL (ref 3.4–10.8)

## 2019-06-12 LAB — LIPID PANEL
Chol/HDL Ratio: 3 ratio (ref 0.0–5.0)
Cholesterol, Total: 158 mg/dL (ref 100–199)
HDL: 53 mg/dL (ref >39)
LDL Chol Calc (NIH): 84 mg/dL (ref 0–99)
Triglycerides: 118 mg/dL (ref 0–149)
VLDL Cholesterol Cal: 21 mg/dL (ref 5–40)

## 2019-06-12 LAB — BASIC METABOLIC PANEL
BUN/Creatinine Ratio: 13 (ref 9–20)
BUN: 16 mg/dL (ref 6–20)
CO2: 19 mmol/L — ABNORMAL LOW (ref 20–29)
Calcium: 9.8 mg/dL (ref 8.7–10.2)
Chloride: 102 mmol/L (ref 96–106)
Creatinine, Ser: 1.2 mg/dL (ref 0.76–1.27)
GFR calc Af Amer: 88 mL/min/{1.73_m2} (ref >59)
GFR calc non Af Amer: 76 mL/min/{1.73_m2} (ref >59)
Glucose: 103 mg/dL — ABNORMAL HIGH (ref 65–99)
Potassium: 3.9 mmol/L (ref 3.5–5.2)
Sodium: 140 mmol/L (ref 134–144)

## 2019-06-12 LAB — HEPATIC FUNCTION PANEL
ALT: 26 IU/L (ref 0–44)
AST: 20 IU/L (ref 0–40)
Albumin: 4.9 g/dL (ref 4.0–5.0)
Alkaline Phosphatase: 76 IU/L (ref 39–117)
Bilirubin Total: 0.6 mg/dL (ref 0.0–1.2)
Bilirubin, Direct: 0.14 mg/dL (ref 0.00–0.40)
Total Protein: 7 g/dL (ref 6.0–8.5)

## 2019-06-12 LAB — C-REACTIVE PROTEIN: CRP: <1 mg/L (ref 0–10)

## 2019-06-12 LAB — TISSUE TRANSGLUTAMINASE, IGA: Transglutaminase IgA: <2 U/mL (ref 0–3)

## 2019-06-17 ENCOUNTER — Telehealth: Payer: Self-pay | Admitting: Family Medicine

## 2019-06-17 NOTE — Telephone Encounter (Signed)
Called pt to do prescreening questions  Pt's wife was tested 06/10/2019 due to sinus symptoms, test was negative, pt did antibiotics & is currently not having any symptoms  Pt is scheduled for a physical 06/18/2019 & is currently not having any symptoms  Can pt be seen 06/18/2019 for physical or move appointment out?  Please advise

## 2019-06-17 NOTE — Telephone Encounter (Signed)
Please advise. Thank you

## 2019-06-17 NOTE — Telephone Encounter (Signed)
He can be seen thank you

## 2019-06-18 ENCOUNTER — Ambulatory Visit: Payer: Federal, State, Local not specified - PPO | Admitting: Family Medicine

## 2019-06-18 ENCOUNTER — Other Ambulatory Visit: Payer: Self-pay

## 2019-06-18 VITALS — BP 122/84 | Temp 98.1°F | Ht 70.0 in | Wt 208.8 lb

## 2019-06-18 DIAGNOSIS — I10 Essential (primary) hypertension: Secondary | ICD-10-CM | POA: Diagnosis not present

## 2019-06-18 DIAGNOSIS — Z0000 Encounter for general adult medical examination without abnormal findings: Secondary | ICD-10-CM | POA: Diagnosis not present

## 2019-06-18 MED ORDER — LISINOPRIL 20 MG PO TABS: 20.0000 mg | ORAL_TABLET | Freq: Every day | ORAL | 1 refills | Status: DC

## 2019-06-18 NOTE — Patient Instructions (Signed)
Hypertension, Adult Hypertension is another name for high blood pressure. High blood pressure forces your heart to work harder to pump blood. This can cause problems over time. There are two numbers in a blood pressure reading. There is a top number (systolic) over a bottom number (diastolic). It is best to have a blood pressure that is below 120/80. Healthy choices can help lower your blood pressure, or you may need medicine to help lower it. What are the causes? The cause of this condition is not known. Some conditions may be related to high blood pressure. What increases the risk?  Smoking.  Having type 2 diabetes mellitus, high cholesterol, or both.  Not getting enough exercise or physical activity.  Being overweight.  Having too much fat, sugar, calories, or salt (sodium) in your diet.  Drinking too much alcohol.  Having long-term (chronic) kidney disease.  Having a family history of high blood pressure.  Age. Risk increases with age.  Race. You may be at higher risk if you are African American.  Gender. Men are at higher risk than women before age 45. After age 65, women are at higher risk than men.  Having obstructive sleep apnea.  Stress. What are the signs or symptoms?  High blood pressure may not cause symptoms. Very high blood pressure (hypertensive crisis) may cause: ? Headache. ? Feelings of worry or nervousness (anxiety). ? Shortness of breath. ? Nosebleed. ? A feeling of being sick to your stomach (nausea). ? Throwing up (vomiting). ? Changes in how you see. ? Very bad chest pain. ? Seizures. How is this treated?  This condition is treated by making healthy lifestyle changes, such as: ? Eating healthy foods. ? Exercising more. ? Drinking less alcohol.  Your health care provider may prescribe medicine if lifestyle changes are not enough to get your blood pressure under control, and if: ? Your top number is above 130. ? Your bottom number is above  80.  Your personal target blood pressure may vary. Follow these instructions at home: Eating and drinking   If told, follow the DASH eating plan. To follow this plan: ? Fill one half of your plate at each meal with fruits and vegetables. ? Fill one fourth of your plate at each meal with whole grains. Whole grains include whole-wheat pasta, brown rice, and whole-grain bread. ? Eat or drink low-fat dairy products, such as skim milk or low-fat yogurt. ? Fill one fourth of your plate at each meal with low-fat (lean) proteins. Low-fat proteins include fish, chicken without skin, eggs, beans, and tofu. ? Avoid fatty meat, cured and processed meat, or chicken with skin. ? Avoid pre-made or processed food.  Eat less than 1,500 mg of salt each day.  Do not drink alcohol if: ? Your doctor tells you not to drink. ? You are pregnant, may be pregnant, or are planning to become pregnant.  If you drink alcohol: ? Limit how much you use to:  0-1 drink a day for women.  0-2 drinks a day for men. ? Be aware of how much alcohol is in your drink. In the [place] one drink equals one 12 oz bottle of beer (355 mL), one 5 oz glass of wine (148 mL), or one 1 oz glass of hard liquor (44 mL). Lifestyle   Work with your doctor to stay at a healthy weight or to lose weight. Ask your doctor what the best weight is for you.  Get at least 30 minutes of exercise most   days of the week. This may include walking, swimming, or biking.  Get at least 30 minutes of exercise that strengthens your muscles (resistance exercise) at least 3 days a week. This may include lifting weights or doing Pilates.  Do not use any products that contain nicotine or tobacco, such as cigarettes, e-cigarettes, and chewing tobacco. If you need help quitting, ask your doctor.  Check your blood pressure at home as told by your doctor.  Keep all follow-up visits as told by your doctor. This is important. Medicines  Take over-the-counter  and prescription medicines only as told by your doctor. Follow directions carefully.  Do not skip doses of blood pressure medicine. The medicine does not work as well if you skip doses. Skipping doses also puts you at risk for problems.  Ask your doctor about side effects or reactions to medicines that you should watch for. Contact a doctor if you:  Think you are having a reaction to the medicine you are taking.  Have headaches that keep coming back (recurring).  Feel dizzy.  Have swelling in your ankles.  Have trouble with your vision. Get help right away if you:  Get a very bad headache.  Start to feel mixed up (confused).  Feel weak or numb.  Feel faint.  Have very bad pain in your: ? Chest. ? Belly (abdomen).  Throw up more than once.  Have trouble breathing. Summary  Hypertension is another name for high blood pressure.  High blood pressure forces your heart to work harder to pump blood.  For most people, a normal blood pressure is less than 120/80.  Making healthy choices can help lower blood pressure. If your blood pressure does not get lower with healthy choices, you may need to take medicine. This information is not intended to replace advice given to you by your health care provider. Make sure you discuss any questions you have with your health care provider. Document Revised: 12/27/2017 Document Reviewed: 12/27/2017 Elsevier Patient Education  2020 [employer] DASH Eating Plan DASH stands for "Dietary Approaches to Stop Hypertension." The DASH eating plan is a healthy eating plan that has been shown to reduce high blood pressure (hypertension). It may also reduce your risk for type 2 diabetes, heart disease, and stroke. The DASH eating plan may also help with weight loss. What are tips for following this plan?  General guidelines  Avoid eating more than 2,300 mg (milligrams) of salt (sodium) a day. If you have hypertension, you may need to reduce your  sodium intake to 1,500 mg a day.  Limit alcohol intake to no more than 1 drink a day for nonpregnant women and 2 drinks a day for men. One drink equals 12 oz of beer, 5 oz of wine, or 1 oz of hard liquor.  Work with your health care provider to maintain a healthy body weight or to lose weight. Ask what an ideal weight is for you.  Get at least 30 minutes of exercise that causes your heart to beat faster (aerobic exercise) most days of the week. Activities may include walking, swimming, or biking.  Work with your health care provider or diet and nutrition specialist (dietitian) to adjust your eating plan to your individual calorie needs. Reading food labels   Check food labels for the amount of sodium per serving. Choose foods with less than 5 percent of the Daily Value of sodium. Generally, foods with less than 300 mg of sodium per serving fit into this eating plan.    To find whole grains, look for the word "whole" as the first word in the ingredient list. Shopping  Buy products labeled as "low-sodium" or "no salt added."  Buy fresh foods. Avoid canned foods and premade or frozen meals. Cooking  Avoid adding salt when cooking. Use salt-free seasonings or herbs instead of table salt or sea salt. Check with your health care provider or pharmacist before using salt substitutes.  Do not fry foods. Cook foods using healthy methods such as baking, boiling, grilling, and broiling instead.  Cook with heart-healthy oils, such as olive, canola, soybean, or sunflower oil. Meal planning  Eat a balanced diet that includes: ? 5 or more servings of fruits and vegetables each day. At each meal, try to fill half of your plate with fruits and vegetables. ? Up to 6-8 servings of whole grains each day. ? Less than 6 oz of lean meat, poultry, or fish each day. A 3-oz serving of meat is about the same size as a deck of cards. One egg equals 1 oz. ? 2 servings of low-fat dairy each day. ? A serving of  nuts, seeds, or beans 5 times each week. ? Heart-healthy fats. Healthy fats called Omega-3 fatty acids are found in foods such as flaxseeds and coldwater fish, like sardines, salmon, and mackerel.  Limit how much you eat of the following: ? Canned or prepackaged foods. ? Food that is high in trans fat, such as fried foods. ? Food that is high in saturated fat, such as fatty meat. ? Sweets, desserts, sugary drinks, and other foods with added sugar. ? Full-fat dairy products.  Do not salt foods before eating.  Try to eat at least 2 vegetarian meals each week.  Eat more home-cooked food and less restaurant, buffet, and fast food.  When eating at a restaurant, ask that your food be prepared with less salt or no salt, if possible. What foods are recommended? The items listed may not be a complete list. Talk with your dietitian about what dietary choices are best for you. Grains Whole-grain or whole-wheat bread. Whole-grain or whole-wheat pasta. Brown rice. Oatmeal. Quinoa. Bulgur. Whole-grain and low-sodium cereals. Pita bread. Low-fat, low-sodium crackers. Whole-wheat flour tortillas. Vegetables Fresh or frozen vegetables (raw, steamed, roasted, or grilled). Low-sodium or reduced-sodium tomato and vegetable juice. Low-sodium or reduced-sodium tomato sauce and tomato paste. Low-sodium or reduced-sodium canned vegetables. Fruits All fresh, dried, or frozen fruit. Canned fruit in natural juice (without added sugar). Meat and other protein foods Skinless chicken or turkey. Ground chicken or turkey. Pork with fat trimmed off. Fish and seafood. Egg whites. Dried beans, peas, or lentils. Unsalted nuts, nut butters, and seeds. Unsalted canned beans. Lean cuts of beef with fat trimmed off. Low-sodium, lean deli meat. Dairy Low-fat (1%) or fat-free (skim) milk. Fat-free, low-fat, or reduced-fat cheeses. Nonfat, low-sodium ricotta or cottage cheese. Low-fat or nonfat yogurt. Low-fat, low-sodium  cheese. Fats and oils Soft margarine without trans fats. Vegetable oil. Low-fat, reduced-fat, or light mayonnaise and salad dressings (reduced-sodium). Canola, safflower, olive, soybean, and sunflower oils. Avocado. Seasoning and other foods Herbs. Spices. Seasoning mixes without salt. Unsalted popcorn and pretzels. Fat-free sweets. What foods are not recommended? The items listed may not be a complete list. Talk with your dietitian about what dietary choices are best for you. Grains Baked goods made with fat, such as croissants, muffins, or some breads. Dry pasta or rice meal packs. Vegetables Creamed or fried vegetables. Vegetables in a cheese sauce. Regular canned vegetables (not   low-sodium or reduced-sodium). Regular canned tomato sauce and paste (not low-sodium or reduced-sodium). Regular tomato and vegetable juice (not low-sodium or reduced-sodium). Pickles. Olives. Fruits Canned fruit in a light or heavy syrup. Fried fruit. Fruit in cream or butter sauce. Meat and other protein foods Fatty cuts of meat. Ribs. Fried meat. Bacon. Sausage. Bologna and other processed lunch meats. Salami. Fatback. Hotdogs. Bratwurst. Salted nuts and seeds. Canned beans with added salt. Canned or smoked fish. Whole eggs or egg yolks. Chicken or turkey with skin. Dairy Whole or 2% milk, cream, and half-and-half. Whole or full-fat cream cheese. Whole-fat or sweetened yogurt. Full-fat cheese. Nondairy creamers. Whipped toppings. Processed cheese and cheese spreads. Fats and oils Butter. Stick margarine. Lard. Shortening. Ghee. Bacon fat. Tropical oils, such as coconut, palm kernel, or palm oil. Seasoning and other foods Salted popcorn and pretzels. Onion salt, garlic salt, seasoned salt, table salt, and sea salt. Worcestershire sauce. Tartar sauce. Barbecue sauce. Teriyaki sauce. Soy sauce, including reduced-sodium. Steak sauce. Canned and packaged gravies. Fish sauce. Oyster sauce. Cocktail sauce. Horseradish  that you find on the shelf. Ketchup. Mustard. Meat flavorings and tenderizers. Bouillon cubes. Hot sauce and Tabasco sauce. Premade or packaged marinades. Premade or packaged taco seasonings. Relishes. Regular salad dressings. Where to find more information:  National Heart, Lung, and Blood Institute: www.nhlbi.nih.gov  American Heart Association: www.heart.org Summary  The DASH eating plan is a healthy eating plan that has been shown to reduce high blood pressure (hypertension). It may also reduce your risk for type 2 diabetes, heart disease, and stroke.  With the DASH eating plan, you should limit salt (sodium) intake to 2,300 mg a day. If you have hypertension, you may need to reduce your sodium intake to 1,500 mg a day.  When on the DASH eating plan, aim to eat more fresh fruits and vegetables, whole grains, lean proteins, low-fat dairy, and heart-healthy fats.  Work with your health care provider or diet and nutrition specialist (dietitian) to adjust your eating plan to your individual calorie needs. This information is not intended to replace advice given to you by your health care provider. Make sure you discuss any questions you have with your health care provider. Document Revised: 03/31/2017 Document Reviewed: 04/11/2016 Elsevier Patient Education  2020 Elsevier Inc.  

## 2019-06-18 NOTE — Telephone Encounter (Signed)
Called & notified pt OK to come in per Dr. Lorin Picket

## 2019-06-18 NOTE — Progress Notes (Signed)
Subjective:    Patient ID: Andre Ramirez, male    DOB: Jul 15, 1980, 39 y.o.   MRN: [id number]  HPI  The patient comes in today for a wellness visit.  Overall patient doing very well watching diet staying physically active managing stress well.  Working hard.  A review of their health history was completed.  A review of medications was also completed.  Any needed refills; yes  Eating habits: eating good  Falls/  MVA accidents in past few months: none  Regular exercise: 5 times a week  Specialist pt sees on regular basis: none  Preventative health issues were discussed.   Additional concerns: none  Results for orders placed or performed in visit on 05/27/19  C-reactive protein  Result Value Ref Range   CRP <1 0 - 10 mg/L  Tissue transglutaminase, IgA  Result Value Ref Range   Transglutaminase IgA <2 0 - 3 U/mL  CBC with Differential  Result Value Ref Range   WBC 6.2 3.4 - 10.8 x10E3/uL   RBC 5.47 4.14 - 5.80 x10E6/uL   Hemoglobin 16.9 13.0 - 17.7 g/dL   Hematocrit 49.6 37.5 - 51.0 %   MCV 91 79 - 97 fL   MCH 30.9 26.6 - 33.0 pg   MCHC 34.1 31.5 - 35.7 g/dL   RDW 12.1 11.6 - 15.4 %   Platelets 218 150 - 450 x10E3/uL   Neutrophils 41 Not Estab. %   Lymphs 46 Not Estab. %   Monocytes 9 Not Estab. %   Eos 3 Not Estab. %   Basos 1 Not Estab. %   Neutrophils Absolute 2.6 1.4 - 7.0 x10E3/uL   Lymphocytes Absolute 2.9 0.7 - 3.1 x10E3/uL   Monocytes Absolute 0.5 0.1 - 0.9 x10E3/uL   EOS (ABSOLUTE) 0.2 0.0 - 0.4 x10E3/uL   Basophils Absolute 0.0 0.0 - 0.2 x10E3/uL   Immature Granulocytes 0 Not Estab. %   Immature Grans (Abs) 0.0 0.0 - 0.1 [id number]  Basic Metabolic Panel (BMET)  Result Value Ref Range   Glucose 103 (H) 65 - 99 mg/dL   BUN 16 6 - 20 mg/dL   Creatinine, Ser 1.20 0.76 - 1.27 mg/dL   GFR calc non Af Amer 76 >59 mL/min/1.73   GFR calc Af Amer 88 >59 mL/min/1.73   BUN/Creatinine Ratio 13 9 - 20   Sodium 140 134 - 144 mmol/L   Potassium 3.9 3.5 - 5.2  mmol/L   Chloride 102 96 - 106 mmol/L   CO2 19 (L) 20 - 29 mmol/L   Calcium 9.8 8.7 - 10.2 mg/dL  Hepatic function panel  Result Value Ref Range   Total Protein 7.0 6.0 - 8.5 g/dL   Albumin 4.9 4.0 - 5.0 g/dL   Bilirubin Total 0.6 0.0 - 1.2 mg/dL   Bilirubin, Direct 0.14 0.00 - 0.40 mg/dL   Alkaline Phosphatase 76 39 - 117 IU/L   AST 20 0 - 40 IU/L   ALT 26 0 - 44 IU/L  Lipid Profile  Result Value Ref Range   Cholesterol, Total 158 100 - 199 mg/dL   Triglycerides 118 0 - 149 mg/dL   HDL 53 >39 mg/dL   VLDL Cholesterol Cal 21 5 - 40 mg/dL   LDL Chol Calc (NIH) 84 0 - 99 mg/dL   Chol/HDL Ratio 3.0 0.0 - 5.0 ratio    Review of Systems  Constitutional: Negative for diaphoresis and fatigue.  HENT: Negative for congestion and rhinorrhea.   Respiratory: Negative for cough and  shortness of breath.   Cardiovascular: Negative for chest pain and leg swelling.  Gastrointestinal: Negative for abdominal pain and diarrhea.  Skin: Negative for color change and rash.  Neurological: Negative for dizziness and headaches.  Psychiatric/Behavioral: Negative for behavioral problems and confusion.       Objective:   Physical Exam Vitals reviewed.  Constitutional:      General: He is not in acute distress. HENT:     Head: Normocephalic and atraumatic.  Eyes:     General:        Right eye: No discharge.        Left eye: No discharge.  Neck:     Trachea: No tracheal deviation.  Cardiovascular:     Rate and Rhythm: Normal rate and regular rhythm.     Heart sounds: Normal heart sounds. No murmur.  Pulmonary:     Effort: Pulmonary effort is normal. No respiratory distress.     Breath sounds: Normal breath sounds.  Lymphadenopathy:     Cervical: No cervical adenopathy.  Skin:    General: Skin is warm and dry.  Neurological:     Mental Status: He is alert.     Coordination: Coordination normal.  Psychiatric:        Behavior: Behavior normal.     Results for orders placed or  performed in visit on 05/27/19  C-reactive protein  Result Value Ref Range   CRP <1 0 - 10 mg/L  Tissue transglutaminase, IgA  Result Value Ref Range   Transglutaminase IgA <2 0 - 3 U/mL  CBC with Differential  Result Value Ref Range   WBC 6.2 3.4 - 10.8 x10E3/uL   RBC 5.47 4.14 - 5.80 x10E6/uL   Hemoglobin 16.9 13.0 - 17.7 g/dL   Hematocrit 18.5 63.1 - 51.0 %   MCV 91 79 - 97 fL   MCH 30.9 26.6 - 33.0 pg   MCHC 34.1 31.5 - 35.7 g/dL   RDW 49.7 02.6 - 37.8 %   Platelets 218 150 - 450 x10E3/uL   Neutrophils 41 Not Estab. %   Lymphs 46 Not Estab. %   Monocytes 9 Not Estab. %   Eos 3 Not Estab. %   Basos 1 Not Estab. %   Neutrophils Absolute 2.6 1.4 - 7.0 x10E3/uL   Lymphocytes Absolute 2.9 0.7 - 3.1 x10E3/uL   Monocytes Absolute 0.5 0.1 - 0.9 x10E3/uL   EOS (ABSOLUTE) 0.2 0.0 - 0.4 x10E3/uL   Basophils Absolute 0.0 0.0 - 0.2 x10E3/uL   Immature Granulocytes 0 Not Estab. %   Immature Grans (Abs) 0.0 0.0 - 0.1 x10E3/uL  Basic Metabolic Panel (BMET)  Result Value Ref Range   Glucose 103 (H) 65 - 99 mg/dL   BUN 16 6 - 20 mg/dL   Creatinine, Ser 5.88 0.76 - 1.27 mg/dL   GFR calc non Af Amer 76 >59 mL/min/1.73   GFR calc Af Amer 88 >59 mL/min/1.73   BUN/Creatinine Ratio 13 9 - 20   Sodium 140 134 - 144 mmol/L   Potassium 3.9 3.5 - 5.2 mmol/L   Chloride 102 96 - 106 mmol/L   CO2 19 (L) 20 - 29 mmol/L   Calcium 9.8 8.7 - 10.2 mg/dL  Hepatic function panel  Result Value Ref Range   Total Protein 7.0 6.0 - 8.5 g/dL   Albumin 4.9 4.0 - 5.0 g/dL   Bilirubin Total 0.6 0.0 - 1.2 mg/dL   Bilirubin, Direct 5.02 0.00 - 0.40 mg/dL   Alkaline Phosphatase 76 39 -  117 IU/L   AST 20 0 - 40 IU/L   ALT 26 0 - 44 IU/L  Lipid Profile  Result Value Ref Range   Cholesterol, Total 158 100 - 199 mg/dL   Triglycerides 528 0 - 149 mg/dL   HDL 53 >41 mg/dL   VLDL Cholesterol Cal 21 5 - 40 mg/dL   LDL Chol Calc (NIH) 84 0 - 99 mg/dL   Chol/HDL Ratio 3.0 0.0 - 5.0 ratio    GU normal prostate  exam not indicated     Assessment & Plan:  We did discuss his lab work Overall looks good Creatinine at 1.20 patient was encouraged to avoid adding excessive protein to his diet Blood pressure was rechecked after several minutes of sitting top number looks great problem number is acceptable continue blood pressure medicine follow-up in 6 months regarding this minimize salt in diet continue cardio exercise Colonoscopy prostate PSA not indicated currently Patient handling stress well but we did discuss importance of avoiding getting to worked up about stuff  Adult wellness-complete.wellness physical was conducted today. Importance of diet and exercise were discussed in detail.  In addition to this a discussion regarding safety was also covered. We also reviewed over immunizations and gave recommendations regarding current immunization needed for age.  In addition to this additional areas were also touched on including: Preventative health exams needed:  Colonoscopy not indicated currently  Patient was advised yearly wellness exam

## 2019-07-29 ENCOUNTER — Telehealth: Payer: Self-pay | Admitting: Family Medicine

## 2019-07-29 NOTE — Telephone Encounter (Signed)
FYI ONLY: He wanted to let us know he got his Covid shots  1ST DOSE 07-01-19        [facility] 2ND DOSE 07-29-19     BOTH LEFT ARM   [facility]

## 2019-07-29 NOTE — Telephone Encounter (Signed)
COVID immunizations noted in chart

## 2019-09-12 ENCOUNTER — Ambulatory Visit: Payer: Federal, State, Local not specified - PPO | Admitting: Family Medicine

## 2019-09-12 ENCOUNTER — Encounter: Payer: Self-pay | Admitting: Family Medicine

## 2019-09-12 ENCOUNTER — Other Ambulatory Visit: Payer: Self-pay

## 2019-09-12 ENCOUNTER — Encounter: Payer: Self-pay | Admitting: Nurse Practitioner

## 2019-09-12 ENCOUNTER — Ambulatory Visit: Payer: Federal, State, Local not specified - PPO | Admitting: Nurse Practitioner

## 2019-09-12 VITALS — BP 144/90 | Temp 97.6°F | Ht 70.0 in | Wt 217.0 lb

## 2019-09-12 DIAGNOSIS — L237 Allergic contact dermatitis due to plants, except food: Secondary | ICD-10-CM

## 2019-09-12 IMAGING — US BILATERAL CAROTID DUPLEX ULTRASOUND
1 series · 13 of 24 positions shown · non-contrast
Comparison: None.

CLINICAL DATA: Hypertension, atherosclerosis

EXAM:
BILATERAL CAROTID DUPLEX ULTRASOUND
TECHNIQUE: Gray scale imaging, color Doppler and duplex ultrasound were
performed of bilateral carotid and vertebral arteries in the neck.

[Series 1: bilateral carotid duplex ultrasound · 13 of 68 slices shown]
[im 1/68]
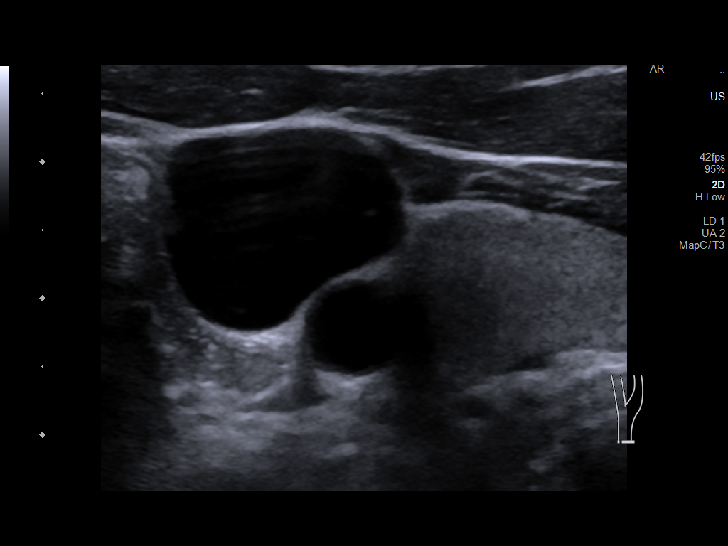
[im 6/68]
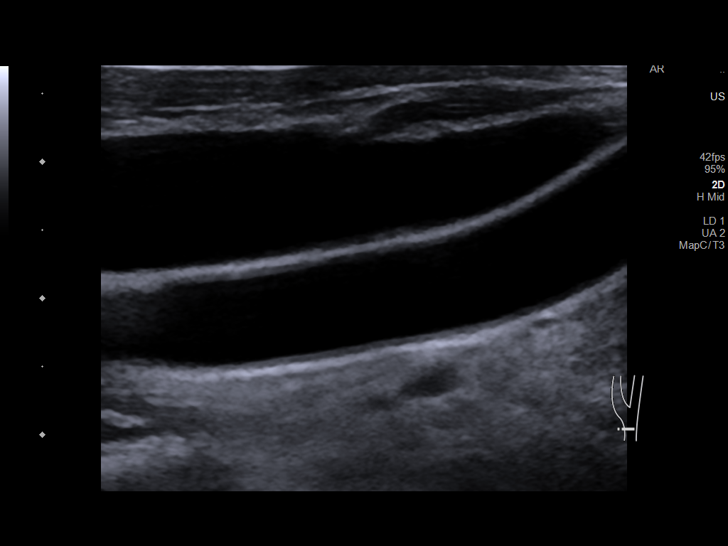
[im 12/68]
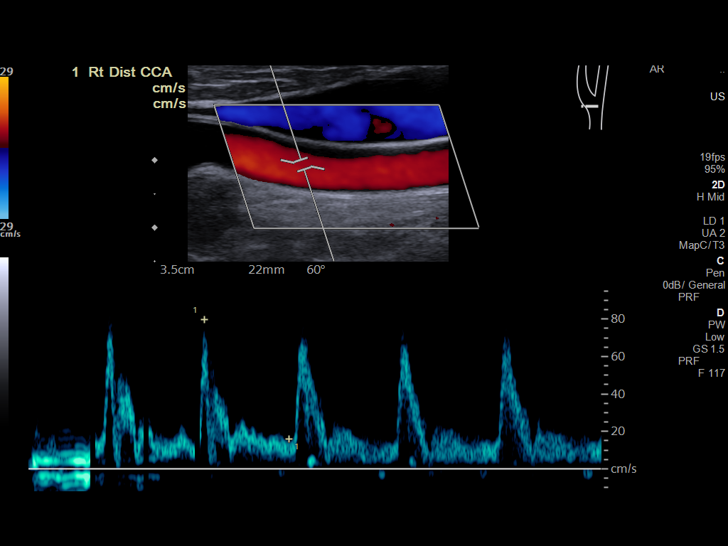
[im 18/68]
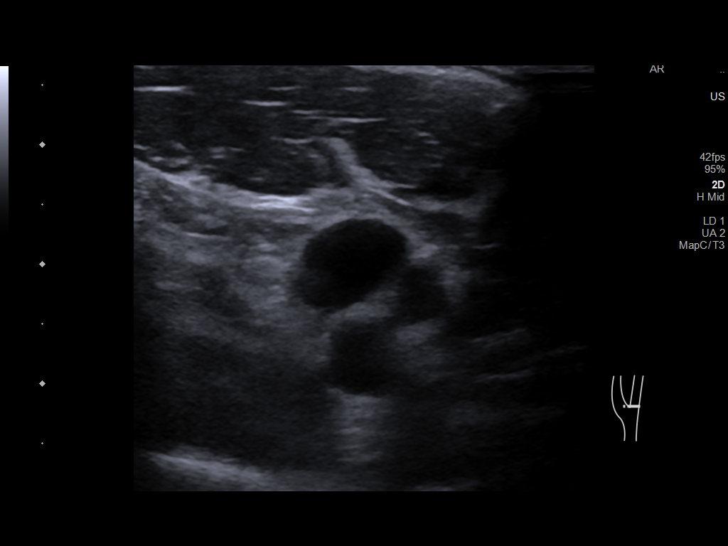
[im 24/68]
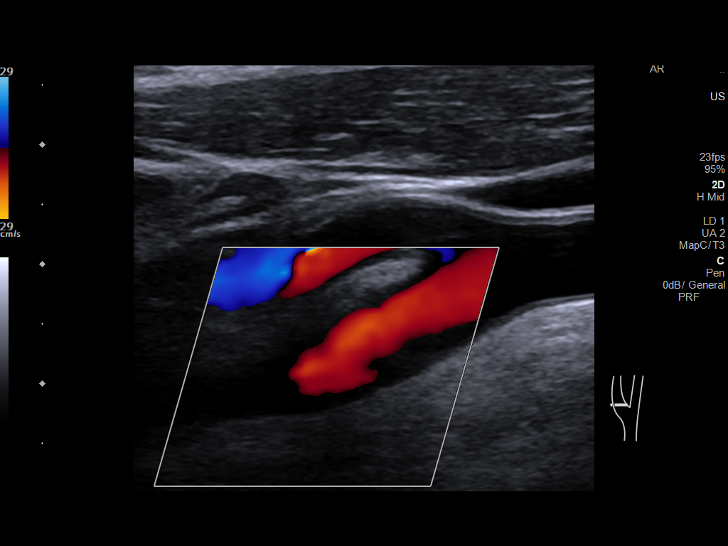
[im 30/68]
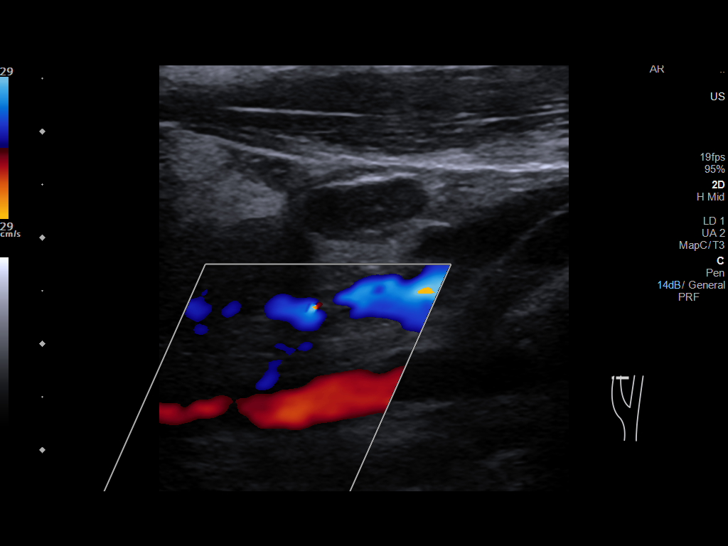
[im 35/68]
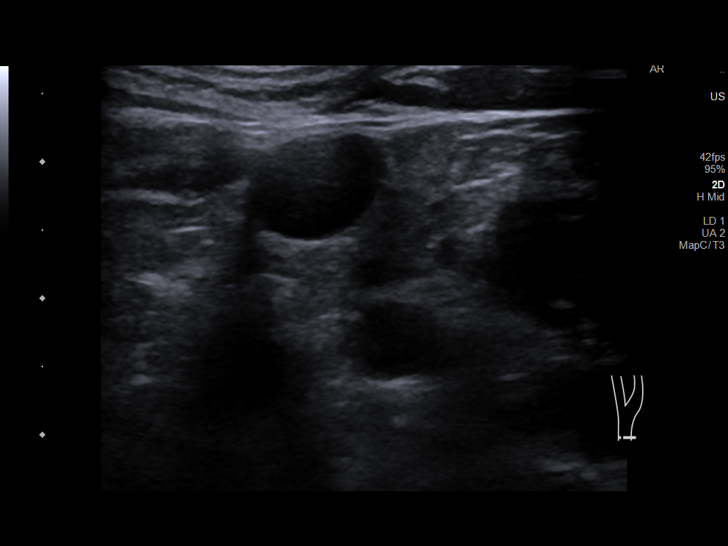
[im 38/68]
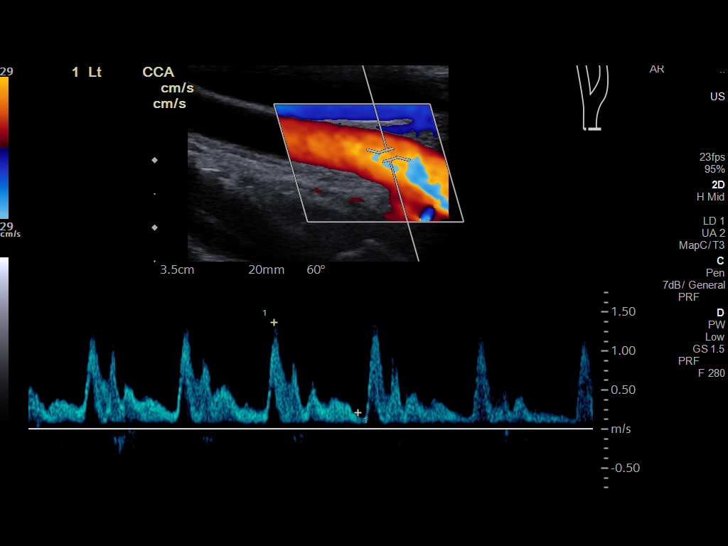
[im 44/68]
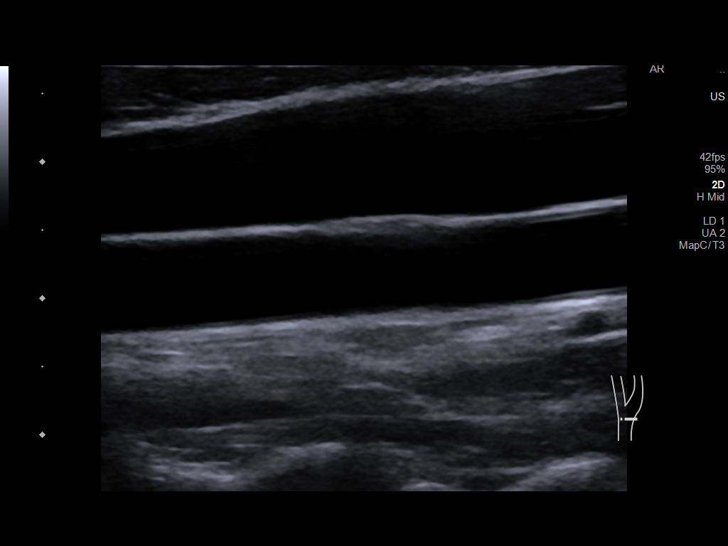
[im 50/68]
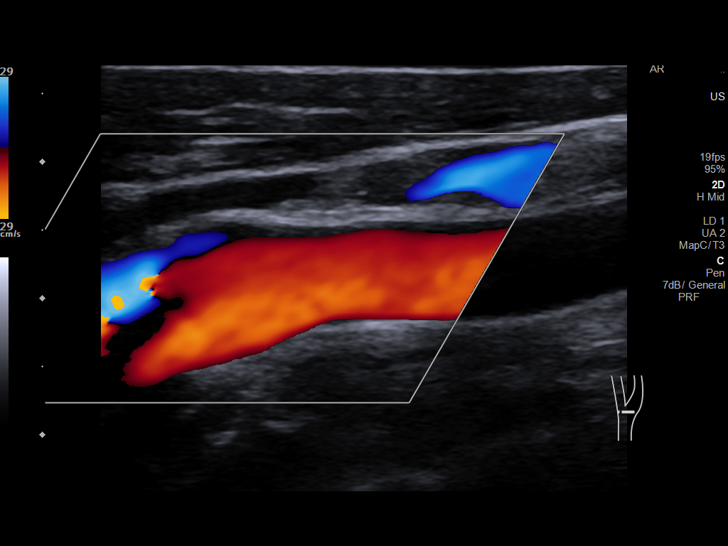
[im 56/68]
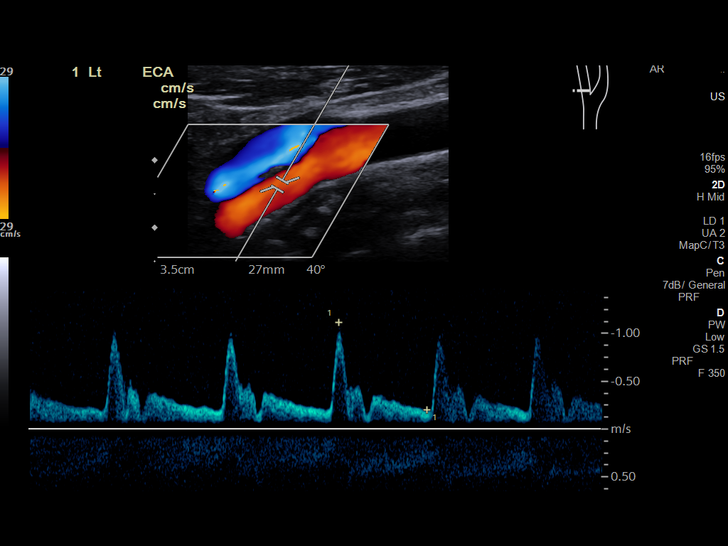
[im 62/68]
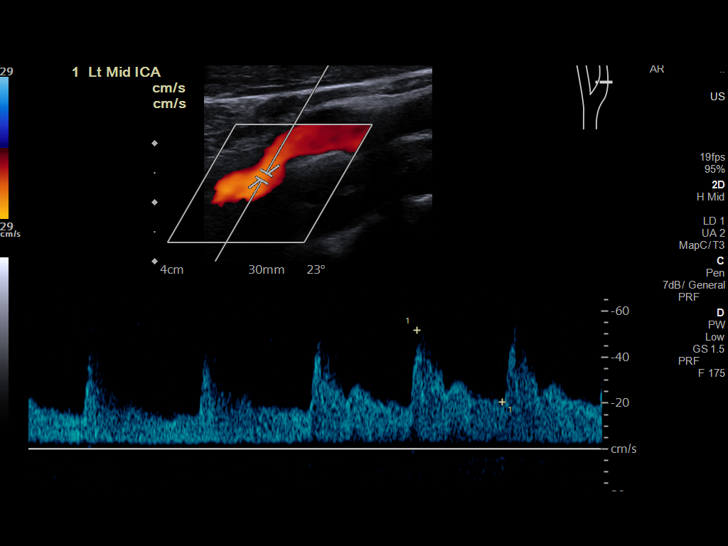
[im 68/68]
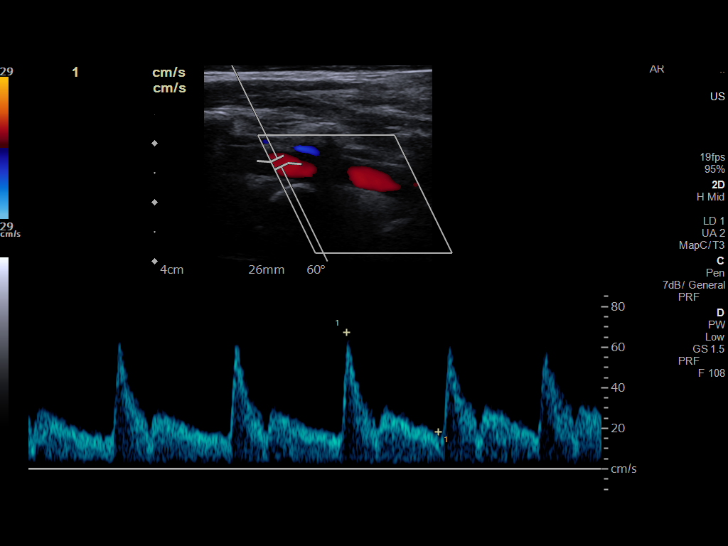

[13 of 24 positions shown; findings below may reference images not displayed]

FINDINGS: Criteria: Quantification of carotid stenosis is based on velocity
parameters that correlate the residual internal carotid diameter
with NASCET-based stenosis levels, using the diameter of the distal
internal carotid lumen as the denominator for stenosis measurement.

The following velocity measurements were obtained:

RIGHT

ICA: 98/21 cm/sec

CCA: 123/27 cm/sec

SYSTOLIC ICA/CCA RATIO:

ECA: 102 cm/sec

LEFT

ICA: 57/22 cm/sec

CCA: 145/28 cm/sec

SYSTOLIC ICA/CCA RATIO:

ECA: 112 cm/sec

RIGHT CAROTID ARTERY: No significant intimal thickening or
atherosclerotic plaque formation. No hemodynamically significant
right ICA stenosis, velocity elevation, or turbulent flow.

RIGHT VERTEBRAL ARTERY:  Antegrade

LEFT CAROTID ARTERY: No significant intimal thickening or plaque
formation. No hemodynamically significant left ICA stenosis,
velocity elevation, or turbulent flow.

LEFT VERTEBRAL ARTERY:  Antegrade
IMPRESSION: No significant carotid atherosclerosis or ICA stenosis by
ultrasound. Degree of narrowing less than 50% bilaterally by
ultrasound criteria.

Patent antegrade vertebral flow bilaterally

## 2019-09-12 MED ORDER — PREDNISONE 20 MG PO TABS: ORAL_TABLET | ORAL | 0 refills | Status: DC

## 2019-09-12 NOTE — Progress Notes (Signed)
   Subjective:    Patient ID: Andre Ramirez, male    DOB: 04/17/1981, 39 y.o.   MRN: [id number]  HPI Presents with multiple areas of pruritic rash. Originated on the right backside of his neck and has spread to left side of neck, bilateral upper arms, abdomen, and groin along with inner thigh. Rash is itchy, slightly red with superficial small raised bumps that in streaks.  Has used OTC Caladryl with minimal relief. 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    Review of Systems  HENT: Negative for facial swelling and trouble swallowing.   Respiratory: Negative for cough, chest tightness, shortness of breath and wheezing.   Cardiovascular: Negative for chest pain.  Skin: Positive for rash.       Located on neck, bilateral biceps, middle abdomen, groin, and inner thigh       Objective:   Physical Exam Cardiovascular:     Rate and Rhythm: Normal rate and regular rhythm.     Heart sounds: Normal heart sounds.  Pulmonary:     Breath sounds: Normal breath sounds.  Skin:    General: Skin is warm.     Findings: Rash present.     Comments: Patches of moderately erythematous papules mostly in a linear pattern along the neck area. A few beginning patches on the face. No eye involvement. Also scattered similar lesions on the abdomen.            Assessment & Plan:   Problem List Items Addressed This Visit    None    Visit Diagnoses    Allergic contact dermatitis due to plants, except food    -  Primary     Meds ordered this encounter  Medications  . predniSONE (DELTASONE) 20 MG tablet    Sig: 3 po qd x 3 d then 2 po qd x 3 d then 1 po qd x 2 d    Dispense:  17 tablet    Refill:  0    Order Specific Question:   Supervising Provider    Answer:   Lilyan Punt A [[id number]]  Take Prednisone as directed. If symptoms do not improve in 3-4 days or get worse please call the office.  Recommend Claritin in the morning and Benadryl at night. Discussed potential side effects of  prednisone. Please notify office if you experience any significant adverse effects.

## 2019-09-12 NOTE — Progress Notes (Signed)
   Subjective:    Patient ID: Andre Ramirez, male    DOB: [birth year], 39 y.o.   MRN: [id number]  HPI    Review of Systems     Objective:   Physical Exam        Assessment & Plan:

## 2019-09-12 NOTE — Progress Notes (Signed)
   Subjective:    Patient ID: Andre Ramirez, Andre Ramirez    DOB: [birth year], 39 y.o.   MRN: [id number]  Poison [place] This is a new problem. The current episode started in the past 7 days. The affected locations include the groin, torso and chest (neck, arms).  Rash      Review of Systems  Skin: Positive for rash.       Objective:   Physical Exam        Assessment & Plan:

## 2019-09-12 NOTE — Patient Instructions (Signed)
Claritin in the morning Benadryl at night time

## 2019-09-13 ENCOUNTER — Encounter: Payer: Self-pay | Admitting: Nurse Practitioner

## 2019-09-13 ENCOUNTER — Ambulatory Visit: Payer: Federal, State, Local not specified - PPO | Admitting: Nurse Practitioner

## 2019-09-14 ENCOUNTER — Encounter: Payer: Self-pay | Admitting: Nurse Practitioner

## 2019-09-22 ENCOUNTER — Encounter: Payer: Self-pay | Admitting: Family Medicine

## 2019-09-23 NOTE — Telephone Encounter (Signed)
Seen 5/13 and pt attached 4 images

## 2019-09-23 NOTE — Telephone Encounter (Signed)
Discussed with Andre Ramirez. Andre Ramirez states he wants to see if dr Lorin Picket thinks it would clear up on its on. Does not really want to go on a 20 day course of prednisone. Also asked if just getting the shot would be better than the 20 day course of prednisone.

## 2019-09-23 NOTE — Telephone Encounter (Signed)
Nurses Please send in the following Prednisone 10 mg, 4 daily for 5 days Then 3 daily for 5 days Then 2 daily for 5 days Then 1 daily for 5 days 50 tablets Please send notification to the patient This tapering over 20 days should prevent any rebound flareup If any ongoing troubles or worsening issues feel free to connect with [place] for an appointment and we can do a shot of steroids in the office thank you

## 2019-09-23 NOTE — Telephone Encounter (Signed)
So another option would be  A brief office visit with myself tomorrow along with shot of steroids and potentially a short course of prednisone

## 2019-09-24 ENCOUNTER — Encounter: Payer: Self-pay | Admitting: Family Medicine

## 2019-09-24 ENCOUNTER — Ambulatory Visit: Payer: Federal, State, Local not specified - PPO | Admitting: Family Medicine

## 2019-09-24 ENCOUNTER — Other Ambulatory Visit: Payer: Self-pay

## 2019-09-24 VITALS — BP 128/78 | Temp 97.7°F | Ht 70.0 in | Wt 216.0 lb

## 2019-09-24 DIAGNOSIS — L259 Unspecified contact dermatitis, unspecified cause: Secondary | ICD-10-CM

## 2019-09-24 MED ORDER — METHYLPREDNISOLONE ACETATE 40 MG/ML IJ SUSP
40.0000 mg | Freq: Once | INTRAMUSCULAR | Status: AC
  Administered 2019-09-24: 40 mg via INTRAMUSCULAR

## 2019-09-24 MED ORDER — FLUCONAZOLE 200 MG PO TABS
200.0000 mg | ORAL_TABLET | Freq: Every day | ORAL | 0 refills | Status: DC
Start: 2019-09-24 — End: 2020-06-18

## 2019-09-24 MED ORDER — PREDNISONE 10 MG PO TABS
ORAL_TABLET | ORAL | 0 refills | Status: DC
Start: 2019-09-24 — End: 2020-06-18

## 2019-09-24 NOTE — Progress Notes (Signed)
   Subjective:    Patient ID: Andre Ramirez, male    DOB: 06/06/1980, 39 y.o.   MRN: [id number]  HP[place]ollow up on [place]. Finished prednisone taper. Cleared up but came back.  He took the prednisone but then he had a flareup causing significant itching some redness other associated issues also initially his blood pressure elevated when he came it but after sitting for significant length of time it is much improved   Review of Systems     Objective:   Physical Exam  Typical contact dermatitis rash on arms neck back But has rash in the groin region consistent with yeast dermatitis      Assessment & Plan:  Contact dermatitis-now with flareup/rebound Depo-Medrol shot Longer steroid taper to prevent flareup GU region has the appearance of yeast infection go ahead with Diflucan  If does not improve over the course the next week to notify us

## 2019-09-24 NOTE — Telephone Encounter (Signed)
Patient scheduled office visit today with Dr Scott 

## 2020-03-07 ENCOUNTER — Other Ambulatory Visit: Payer: Self-pay | Admitting: Family Medicine

## 2020-05-12 ENCOUNTER — Other Ambulatory Visit: Payer: Federal, State, Local not specified - PPO

## 2020-05-12 DIAGNOSIS — Z20822 Contact with and (suspected) exposure to covid-19: Secondary | ICD-10-CM

## 2020-05-14 LAB — NOVEL CORONAVIRUS, NAA: SARS-CoV-2, NAA: NOT DETECTED

## 2020-05-14 LAB — SARS-COV-2, NAA 2 DAY TAT

## 2020-06-07 ENCOUNTER — Other Ambulatory Visit: Payer: Self-pay | Admitting: Family Medicine

## 2020-06-09 NOTE — Telephone Encounter (Signed)
Patient has appointment 2/17

## 2020-06-18 ENCOUNTER — Other Ambulatory Visit: Payer: Self-pay | Admitting: Family Medicine

## 2020-06-18 ENCOUNTER — Other Ambulatory Visit: Payer: Self-pay

## 2020-06-18 ENCOUNTER — Ambulatory Visit (INDEPENDENT_AMBULATORY_CARE_PROVIDER_SITE_OTHER): Payer: Federal, State, Local not specified - PPO | Admitting: Family Medicine

## 2020-06-18 ENCOUNTER — Encounter: Payer: Self-pay | Admitting: Family Medicine

## 2020-06-18 VITALS — BP 130/88 | HR 95 | Temp 97.8°F | Wt 215.6 lb

## 2020-06-18 DIAGNOSIS — I10 Essential (primary) hypertension: Secondary | ICD-10-CM | POA: Diagnosis not present

## 2020-06-18 DIAGNOSIS — Z13220 Encounter for screening for lipoid disorders: Secondary | ICD-10-CM

## 2020-06-18 NOTE — Patient Instructions (Addendum)
Monitor your blood pressure at home to ensure it returns to normal. Le [place] know if it does not.     Hypertension, Adult Hypertension is another name for high blood pressure. High blood pressure forces your heart to work harder to pump blood. This can cause problems over time. There are two numbers in a blood pressure reading. There is a top number (systolic) over a bottom number (diastolic). It is best to have a blood pressure that is below 120/80. Healthy choices can help lower your blood pressure, or you may need medicine to help lower it. What are the causes? The cause of this condition is not known. Some conditions may be related to high blood pressure. What increases the risk?  Smoking.  Having type 2 diabetes mellitus, high cholesterol, or both.  Not getting enough exercise or physical activity.  Being overweight.  Having too much fat, sugar, calories, or salt (sodium) in your diet.  Drinking too much alcohol.  Having long-term (chronic) kidney disease.  Having a family history of high blood pressure.  Age. Risk increases with age.  Race. You may be at higher risk if you are African American.  Gender. Men are at higher risk than women before age 19. After age 67, women are at higher risk than men.  Having obstructive sleep apnea.  Stress. What are the signs or symptoms?  High blood pressure may not cause symptoms. Very high blood pressure (hypertensive crisis) may cause: ? Headache. ? Feelings of worry or nervousness (anxiety). ? Shortness of breath. ? Nosebleed. ? A feeling of being sick to your stomach (nausea). ? Throwing up (vomiting). ? Changes in how you see. ? Very bad chest pain. ? Seizures. How is this treated?  This condition is treated by making healthy lifestyle changes, such as: ? Eating healthy foods. ? Exercising more. ? Drinking less alcohol.  Your health care provider may prescribe medicine if lifestyle changes are not enough to get your  blood pressure under control, and if: ? Your top number is above 130. ? Your bottom number is above 80.  Your personal target blood pressure may vary. Follow these instructions at home: Eating and drinking  If told, follow the DASH eating plan. To follow this plan: ? Fill one half of your plate at each meal with fruits and vegetables. ? Fill one fourth of your plate at each meal with whole grains. Whole grains include whole-wheat pasta, brown rice, and whole-grain bread. ? Eat or drink low-fat dairy products, such as skim milk or low-fat yogurt. ? Fill one fourth of your plate at each meal with low-fat (lean) proteins. Low-fat proteins include fish, chicken without skin, eggs, beans, and tofu. ? Avoid fatty meat, cured and processed meat, or chicken with skin. ? Avoid pre-made or processed food.  Eat less than 1,500 mg of salt each day.  Do not drink alcohol if: ? Your doctor tells you not to drink. ? You are pregnant, may be pregnant, or are planning to become pregnant.  If you drink alcohol: ? Limit how much you use to:  0-1 drink a day for women.  0-2 drinks a day for men. ? Be aware of how much alcohol is in your drink. In the [place] one drink equals one 12 oz bottle of beer (355 mL), one 5 oz glass of wine (148 mL), or one 1 oz glass of hard liquor (44 mL).   Lifestyle  Work with your doctor to stay at a healthy weight  or to lose weight. Ask your doctor what the best weight is for you.  Get at least 30 minutes of exercise most days of the week. This may include walking, swimming, or biking.  Get at least 30 minutes of exercise that strengthens your muscles (resistance exercise) at least 3 days a week. This may include lifting weights or doing Pilates.  Do not use any products that contain nicotine or tobacco, such as cigarettes, e-cigarettes, and chewing tobacco. If you need help quitting, ask your doctor.  Check your blood pressure at home as told by your doctor.  Keep  all follow-up visits as told by your doctor. This is important.   Medicines  Take over-the-counter and prescription medicines only as told by your doctor. Follow directions carefully.  Do not skip doses of blood pressure medicine. The medicine does not work as well if you skip doses. Skipping doses also puts you at risk for problems.  Ask your doctor about side effects or reactions to medicines that you should watch for. Contact a doctor if you:  Think you are having a reaction to the medicine you are taking.  Have headaches that keep coming back (recurring).  Feel dizzy.  Have swelling in your ankles.  Have trouble with your vision. Get help right away if you:  Get a very bad headache.  Start to feel mixed up (confused).  Feel weak or numb.  Feel faint.  Have very bad pain in your: ? Chest. ? Belly (abdomen).  Throw up more than once.  Have trouble breathing. Summary  Hypertension is another name for high blood pressure.  High blood pressure forces your heart to work harder to pump blood.  For most people, a normal blood pressure is less than 120/80.  Making healthy choices can help lower blood pressure. If your blood pressure does not get lower with healthy choices, you may need to take medicine. This information is not intended to replace advice given to you by your health care provider. Make sure you discuss any questions you have with your health care provider. Document Revised: 12/27/2017 Document Reviewed: 12/27/2017 Elsevier Patient Education  2021 [employer].

## 2020-06-18 NOTE — Progress Notes (Signed)
Pt here for med check. Pt is taking Lisinopril 20 mg once daily. Pt has been checking blood pressure at home. No issues regarding blood pressure.     Patient ID: Andre Ramirez, male    DOB: [birth year], 40 y.o.   MRN: [id number]   Chief Complaint  Patient presents with  . Hypertension   Subjective:  CC: medication management for HTN  Presents today for medication management for hypertension.  Last wellness exam February 2021.  Labs done at that time.  Refills sent by Dr. Lilyan Punt on June 09, 2020 for 63-month supply.  Patient will schedule wellness exam with Dr. Lilyan Punt, labs will be done 1 week prior to that visit.  Denies fever, chills, chest pain, shortness of breath.  Does report that blood pressure is usually higher once he comes into the doctor's office, whitecoat syndrome.  Reports that his take his blood pressure yesterday was 127/76, today at home it was 131/81.    Medical History Isom has no past medical history on file.   Outpatient Encounter Medications as of 06/18/2020  Medication Sig  . lisinopril (ZESTRIL) 20 MG tablet TAKE 1 TABLET BY MOUTH EVERY DAY  . [DISCONTINUED] fluconazole (DIFLUCAN) 200 MG tablet Take 1 tablet (200 mg total) by mouth daily.  . [DISCONTINUED] predniSONE (DELTASONE) 10 MG tablet Take 4 qd for 5 days, then 3 qd for 5 days, then 2 qd for 5 days, then one qd for 5 days   No facility-administered encounter medications on file as of 06/18/2020.     Review of Systems  Constitutional: Negative for chills, fatigue and fever.  Eyes: Negative for visual disturbance.  Respiratory: Negative for cough and shortness of breath.   Cardiovascular: Negative for chest pain, palpitations and leg swelling.  Neurological: Negative for headaches.     Vitals BP 130/88   Pulse 95   Temp 97.8 F (36.6 C)   Wt 215 lb 9.6 oz (97.8 kg)   SpO2 99%   BMI 30.94 kg/m   Objective:   Physical Exam Vitals reviewed.  Constitutional:       Appearance: Normal appearance.  Cardiovascular:     Rate and Rhythm: Normal rate and regular rhythm.     Heart sounds: Normal heart sounds.  Pulmonary:     Effort: Pulmonary effort is normal.     Breath sounds: Normal breath sounds.  Skin:    General: Skin is warm and dry.  Neurological:     General: No focal deficit present.     Mental Status: He is alert.  Psychiatric:        Behavior: Behavior normal.      Assessment and Plan   1. Essential hypertension    Hypertension Medication compliance: taking as prescribed.  Denies chest pain, shortness of breath, lower extremity edema, vision changes, headaches.  Pertinent lab work: last labs done 06/11/19- due for labs soon (orders placed) ASCVD 10- year risk score: not at this visit.  Monitoring: every 6 months Side effects: none  Continue current medication regimen: Lisinopril 20 mg daily Continue to watch sodium intake.   Agrees with plan of care discussed today. Understands warning signs to seek further care: chest pain, shortness of breath, any significant change in health.  Understands to follow-up for annual wellness with labs prior to that visit.  Labs ordered today.  No refills needed today, refill sent on February 8 by Dr. Lilyan Punt.   Novella Olive, NP 06/18/2020

## 2020-06-18 NOTE — Progress Notes (Signed)
Labs for physical

## 2020-09-14 ENCOUNTER — Encounter: Payer: Self-pay | Admitting: Family Medicine

## 2020-09-14 MED ORDER — LISINOPRIL 20 MG PO TABS: 1.0000 | ORAL_TABLET | Freq: Every day | ORAL | 1 refills | Status: DC

## 2020-09-14 NOTE — Telephone Encounter (Signed)
May have 90-day with 1 refill to follow-up office visit later this year for wellness

## 2020-09-14 NOTE — Addendum Note (Signed)
Addended by: Marlowe Shores on: 09/14/2020 01:07 PM   Modules accepted: Orders

## 2021-02-10 DIAGNOSIS — H903 Sensorineural hearing loss, bilateral: Secondary | ICD-10-CM | POA: Diagnosis not present

## 2021-02-18 ENCOUNTER — Other Ambulatory Visit: Payer: Self-pay | Admitting: Family Medicine

## 2021-03-14 ENCOUNTER — Other Ambulatory Visit: Payer: Self-pay | Admitting: Family Medicine

## 2021-03-15 NOTE — Telephone Encounter (Signed)
Sent my chart message to schedule appointment 03/15/21 

## 2021-04-12 ENCOUNTER — Telehealth: Payer: Self-pay | Admitting: Family Medicine

## 2021-04-12 DIAGNOSIS — Z0000 Encounter for general adult medical examination without abnormal findings: Secondary | ICD-10-CM

## 2021-04-12 DIAGNOSIS — I10 Essential (primary) hypertension: Secondary | ICD-10-CM

## 2021-04-12 DIAGNOSIS — Z1159 Encounter for screening for other viral diseases: Secondary | ICD-10-CM

## 2021-04-12 DIAGNOSIS — Z79899 Other long term (current) drug therapy: Secondary | ICD-10-CM

## 2021-04-12 NOTE — Telephone Encounter (Signed)
Patient needing labs for physical 05/06/21

## 2021-04-12 NOTE — Telephone Encounter (Signed)
Last labs 06/2019: Lipid, Liver, Met 7, CBC

## 2021-04-12 NOTE — Telephone Encounter (Signed)
Hep C antibody per CDC guideline, metabolic 7, liver profile, lipid, CBC-wellness, HTN

## 2021-04-13 NOTE — Telephone Encounter (Signed)
Lab orders placed and pt is aware 

## 2021-04-22 DIAGNOSIS — Z20822 Contact with and (suspected) exposure to covid-19: Secondary | ICD-10-CM | POA: Diagnosis not present

## 2021-04-22 NOTE — Telephone Encounter (Signed)
Sent second-request to schedule appointment 04/22/21 °

## 2021-04-29 DIAGNOSIS — Z79899 Other long term (current) drug therapy: Secondary | ICD-10-CM | POA: Diagnosis not present

## 2021-04-29 DIAGNOSIS — I10 Essential (primary) hypertension: Secondary | ICD-10-CM | POA: Diagnosis not present

## 2021-04-29 DIAGNOSIS — Z1159 Encounter for screening for other viral diseases: Secondary | ICD-10-CM | POA: Diagnosis not present

## 2021-04-29 DIAGNOSIS — Z0000 Encounter for general adult medical examination without abnormal findings: Secondary | ICD-10-CM | POA: Diagnosis not present

## 2021-04-30 ENCOUNTER — Encounter: Payer: Self-pay | Admitting: Family Medicine

## 2021-04-30 DIAGNOSIS — Z20822 Contact with and (suspected) exposure to covid-19: Secondary | ICD-10-CM | POA: Diagnosis not present

## 2021-04-30 LAB — LIPID PANEL
Chol/HDL Ratio: 3.6 ratio (ref 0.0–5.0)
Cholesterol, Total: 174 mg/dL (ref 100–199)
HDL: 49 mg/dL (ref >39)
LDL Chol Calc (NIH): 112 mg/dL — ABNORMAL HIGH (ref 0–99)
Triglycerides: 68 mg/dL (ref 0–149)
VLDL Cholesterol Cal: 13 mg/dL (ref 5–40)

## 2021-04-30 LAB — CBC WITH DIFFERENTIAL/PLATELET
Basophils Absolute: 0 10*3/uL (ref 0.0–0.2)
Basos: 1 %
EOS (ABSOLUTE): 0.1 10*3/uL (ref 0.0–0.4)
Eos: 3 %
Hematocrit: 46.9 % (ref 37.5–51.0)
Hemoglobin: 16.6 g/dL (ref 13.0–17.7)
Immature Grans (Abs): 0 10*3/uL (ref 0.0–0.1)
Immature Granulocytes: 0 %
Lymphocytes Absolute: 2.4 10*3/uL (ref 0.7–3.1)
Lymphs: 44 %
MCH: 30.6 pg (ref 26.6–33.0)
MCHC: 35.4 g/dL (ref 31.5–35.7)
MCV: 86 fL (ref 79–97)
Monocytes Absolute: 0.4 10*3/uL (ref 0.1–0.9)
Monocytes: 7 %
Neutrophils Absolute: 2.4 10*3/uL (ref 1.4–7.0)
Neutrophils: 45 %
Platelets: 207 10*3/uL (ref 150–450)
RBC: 5.43 x10E6/uL (ref 4.14–5.80)
RDW: 11.8 % (ref 11.6–15.4)
WBC: 5.3 10*3/uL (ref 3.4–10.8)

## 2021-04-30 LAB — HEPATIC FUNCTION PANEL
ALT: 34 IU/L (ref 0–44)
AST: 24 IU/L (ref 0–40)
Albumin: 5.2 g/dL — ABNORMAL HIGH (ref 4.0–5.0)
Alkaline Phosphatase: 74 IU/L (ref 44–121)
Bilirubin Total: 0.6 mg/dL (ref 0.0–1.2)
Bilirubin, Direct: 0.16 mg/dL (ref 0.00–0.40)
Total Protein: 7.3 g/dL (ref 6.0–8.5)

## 2021-04-30 LAB — BASIC METABOLIC PANEL
BUN/Creatinine Ratio: 13 (ref 9–20)
BUN: 15 mg/dL (ref 6–24)
CO2: 21 mmol/L (ref 20–29)
Calcium: 9.5 mg/dL (ref 8.7–10.2)
Chloride: 101 mmol/L (ref 96–106)
Creatinine, Ser: 1.15 mg/dL (ref 0.76–1.27)
Glucose: 96 mg/dL (ref 70–99)
Potassium: 4.1 mmol/L (ref 3.5–5.2)
Sodium: 138 mmol/L (ref 134–144)
eGFR: 83 mL/min/{1.73_m2} (ref >59)

## 2021-04-30 LAB — HEPATITIS C ANTIBODY: Hep C Virus Ab: <0.1 s/co ratio (ref 0.0–0.9)

## 2021-04-30 MED ORDER — LISINOPRIL 20 MG PO TABS: 20.0000 mg | ORAL_TABLET | Freq: Every day | ORAL | 0 refills | Status: DC

## 2021-05-06 ENCOUNTER — Ambulatory Visit: Payer: Federal, State, Local not specified - PPO | Admitting: Family Medicine

## 2021-05-09 ENCOUNTER — Other Ambulatory Visit: Payer: Self-pay | Admitting: Family Medicine

## 2021-05-11 ENCOUNTER — Other Ambulatory Visit: Payer: Self-pay

## 2021-05-11 ENCOUNTER — Ambulatory Visit (INDEPENDENT_AMBULATORY_CARE_PROVIDER_SITE_OTHER): Payer: Federal, State, Local not specified - PPO | Admitting: Family Medicine

## 2021-05-11 VITALS — BP 122/82 | Ht 70.0 in | Wt 216.6 lb

## 2021-05-11 DIAGNOSIS — I10 Essential (primary) hypertension: Secondary | ICD-10-CM | POA: Diagnosis not present

## 2021-05-11 DIAGNOSIS — Z23 Encounter for immunization: Secondary | ICD-10-CM

## 2021-05-11 DIAGNOSIS — Z0001 Encounter for general adult medical examination with abnormal findings: Secondary | ICD-10-CM | POA: Diagnosis not present

## 2021-05-11 DIAGNOSIS — Z0000 Encounter for general adult medical examination without abnormal findings: Secondary | ICD-10-CM

## 2021-05-11 MED ORDER — LISINOPRIL 20 MG PO TABS: 20.0000 mg | ORAL_TABLET | Freq: Every day | ORAL | 3 refills | Status: DC

## 2021-05-11 NOTE — Patient Instructions (Signed)

## 2021-05-11 NOTE — Progress Notes (Signed)
Subjective:    Patient ID: Andre Ramirez, male    DOB: Sep 07, 1980, 41 y.o.   MRN: [id number]  HPI The patient comes in today for a wellness visit.  Extremely nice patient Handling stress well Works on regular basis Denies any major setbacks recently Takes his blood pressure medicine on a regular basis   A review of their health history was completed.  A review of medications was also completed.  Any needed refills; yes- lisinopril  Eating habits: trying to eat healthy  Falls/  MVA accidents in past few months: no  Regular exercise: yes- cardio and weight lifting  Specialist pt sees on regular basis: no  Preventative health issues were discussed.   Additional concerns: no  Results for orders placed or performed in visit on 04/12/21  Hepatitis C Antibody  Result Value Ref Range   Hep C Virus Ab <0.1 0.0 - 0.9 s/co ratio  Basic Metabolic Panel (BMET)  Result Value Ref Range   Glucose 96 70 - 99 mg/dL   BUN 15 6 - 24 mg/dL   Creatinine, Ser 1.15 0.76 - 1.27 mg/dL   eGFR 83 >59 mL/min/1.73   BUN/Creatinine Ratio 13 9 - 20   Sodium 138 134 - 144 mmol/L   Potassium 4.1 3.5 - 5.2 mmol/L   Chloride 101 96 - 106 mmol/L   CO2 21 20 - 29 mmol/L   Calcium 9.5 8.7 - 10.2 mg/dL  Hepatic function panel  Result Value Ref Range   Total Protein 7.3 6.0 - 8.5 g/dL   Albumin 5.2 (H) 4.0 - 5.0 g/dL   Bilirubin Total 0.6 0.0 - 1.2 mg/dL   Bilirubin, Direct 0.16 0.00 - 0.40 mg/dL   Alkaline Phosphatase 74 44 - 121 IU/L   AST 24 0 - 40 IU/L   ALT 34 0 - 44 IU/L  Lipid Profile  Result Value Ref Range   Cholesterol, Total 174 100 - 199 mg/dL   Triglycerides 68 0 - 149 mg/dL   HDL 49 >39 mg/dL   VLDL Cholesterol Cal 13 5 - 40 mg/dL   LDL Chol Calc (NIH) 112 (H) 0 - 99 mg/dL   Chol/HDL Ratio 3.6 0.0 - 5.0 ratio  CBC with Differential  Result Value Ref Range   WBC 5.3 3.4 - 10.8 x10E3/uL   RBC 5.43 4.14 - 5.80 x10E6/uL   Hemoglobin 16.6 13.0 - 17.7 g/dL   Hematocrit 46.9  37.5 - 51.0 %   MCV 86 79 - 97 fL   MCH 30.6 26.6 - 33.0 pg   MCHC 35.4 31.5 - 35.7 g/dL   RDW 11.8 11.6 - 15.4 %   Platelets 207 150 - 450 x10E3/uL   Neutrophils 45 Not Estab. %   Lymphs 44 Not Estab. %   Monocytes 7 Not Estab. %   Eos 3 Not Estab. %   Basos 1 Not Estab. %   Neutrophils Absolute 2.4 1.4 - 7.0 x10E3/uL   Lymphocytes Absolute 2.4 0.7 - 3.1 x10E3/uL   Monocytes Absolute 0.4 0.1 - 0.9 x10E3/uL   EOS (ABSOLUTE) 0.1 0.0 - 0.4 x10E3/uL   Basophils Absolute 0.0 0.0 - 0.2 x10E3/uL   Immature Granulocytes 0 Not Estab. %   Immature Grans (Abs) 0.0 0.0 - 0.1 x10E3/uL     Review of Systems     Objective:   Physical Exam General-in no acute distress Eyes-no discharge Lungs-respiratory rate normal, CTA CV-no murmurs,RRR Extremities skin warm dry no edema Neuro grossly normal Behavior normal, alert Prostate  exam not indicated       Assessment & Plan:  1. Well adult exam Adult wellness-complete.wellness physical was conducted today. Importance of diet and exercise were discussed in detail.  In addition to this a discussion regarding safety was also covered. We also reviewed over immunizations and gave recommendations regarding current immunization needed for age.  In addition to this additional areas were also touched on including: Preventative health exams needed:  Colonoscopy not indicated  Patient was advised yearly wellness exam Information on pneumococcal vaccine provided to the patient rationale discussed.  2. Primary hypertension Blood pressure good control continue current measures he checks his blood pressure periodically at home is under good control.  Continue current medicine.  Follow-up within 1 year.  3. Need for vaccination Pneumococcal vaccine because of previous health history including hypertension - Pneumococcal conjugate vaccine 20-valent

## 2021-05-14 NOTE — Telephone Encounter (Signed)
Had appointment 05/11/21

## 2022-03-21 DIAGNOSIS — L858 Other specified epidermal thickening: Secondary | ICD-10-CM | POA: Diagnosis not present

## 2022-03-21 DIAGNOSIS — D2371 Other benign neoplasm of skin of right lower limb, including hip: Secondary | ICD-10-CM | POA: Diagnosis not present

## 2022-03-21 DIAGNOSIS — L820 Inflamed seborrheic keratosis: Secondary | ICD-10-CM | POA: Diagnosis not present

## 2022-03-21 DIAGNOSIS — L821 Other seborrheic keratosis: Secondary | ICD-10-CM | POA: Diagnosis not present

## 2022-03-21 DIAGNOSIS — D225 Melanocytic nevi of trunk: Secondary | ICD-10-CM | POA: Diagnosis not present

## 2022-05-11 ENCOUNTER — Other Ambulatory Visit: Payer: Self-pay | Admitting: Family Medicine

## 2022-07-05 DIAGNOSIS — K080 Exfoliation of teeth due to systemic causes: Secondary | ICD-10-CM | POA: Diagnosis not present

## 2022-11-06 ENCOUNTER — Encounter: Payer: Self-pay | Admitting: Family Medicine

## 2022-11-07 ENCOUNTER — Telehealth: Payer: Self-pay | Admitting: Family Medicine

## 2022-11-07 MED ORDER — LISINOPRIL 20 MG PO TABS: 20.0000 mg | ORAL_TABLET | Freq: Every day | ORAL | 1 refills | Status: DC

## 2022-11-07 NOTE — Telephone Encounter (Signed)
Patient has physical 8/28 and needing labs

## 2022-11-08 NOTE — Telephone Encounter (Signed)
Nurses May order CBC, lipid, liver, metabolic 7  (Please inquire with the patient if he has a family history of prostate cancer if he does order PSA otherwise do not need to order PSA)

## 2022-11-10 ENCOUNTER — Other Ambulatory Visit: Payer: Self-pay

## 2022-11-10 DIAGNOSIS — I10 Essential (primary) hypertension: Secondary | ICD-10-CM

## 2022-11-10 DIAGNOSIS — Z0000 Encounter for general adult medical examination without abnormal findings: Secondary | ICD-10-CM

## 2022-11-10 DIAGNOSIS — Z79899 Other long term (current) drug therapy: Secondary | ICD-10-CM

## 2022-11-10 DIAGNOSIS — Z13220 Encounter for screening for lipoid disorders: Secondary | ICD-10-CM

## 2022-11-10 DIAGNOSIS — I6529 Occlusion and stenosis of unspecified carotid artery: Secondary | ICD-10-CM

## 2022-11-10 NOTE — Telephone Encounter (Signed)
Spoke with the patient per the following note Nurses May order CBC, lipid, liver, metabolic 7   (Please inquire with the patient if he has a family history of prostate cancer if he does order PSA otherwise do not need to order PSA)  -- patient stated he is not sure about PSA issues in the family and will double check and let us know- rest of the labs have been ordered

## 2022-12-23 DIAGNOSIS — I6529 Occlusion and stenosis of unspecified carotid artery: Secondary | ICD-10-CM | POA: Diagnosis not present

## 2022-12-23 DIAGNOSIS — Z79899 Other long term (current) drug therapy: Secondary | ICD-10-CM | POA: Diagnosis not present

## 2022-12-23 DIAGNOSIS — I10 Essential (primary) hypertension: Secondary | ICD-10-CM | POA: Diagnosis not present

## 2022-12-23 DIAGNOSIS — Z0000 Encounter for general adult medical examination without abnormal findings: Secondary | ICD-10-CM | POA: Diagnosis not present

## 2022-12-28 ENCOUNTER — Encounter: Payer: Self-pay | Admitting: Family Medicine

## 2022-12-28 ENCOUNTER — Ambulatory Visit (INDEPENDENT_AMBULATORY_CARE_PROVIDER_SITE_OTHER): Payer: Federal, State, Local not specified - PPO | Admitting: Family Medicine

## 2022-12-28 VITALS — BP 122/80 | HR 81 | Temp 97.9°F | Ht 70.0 in | Wt 226.0 lb

## 2022-12-28 DIAGNOSIS — R7989 Other specified abnormal findings of blood chemistry: Secondary | ICD-10-CM

## 2022-12-28 DIAGNOSIS — Z1159 Encounter for screening for other viral diseases: Secondary | ICD-10-CM

## 2022-12-28 DIAGNOSIS — I10 Essential (primary) hypertension: Secondary | ICD-10-CM | POA: Diagnosis not present

## 2022-12-28 DIAGNOSIS — Z0001 Encounter for general adult medical examination with abnormal findings: Secondary | ICD-10-CM

## 2022-12-28 DIAGNOSIS — Z0000 Encounter for general adult medical examination without abnormal findings: Secondary | ICD-10-CM

## 2022-12-28 DIAGNOSIS — R7401 Elevation of levels of liver transaminase levels: Secondary | ICD-10-CM | POA: Diagnosis not present

## 2022-12-28 MED ORDER — WEGOVY 0.25 MG/0.5ML ~~LOC~~ SOAJ: SUBCUTANEOUS | 0 refills | Status: DC

## 2022-12-28 NOTE — Patient Instructions (Signed)
Discussion of GLP-1 medications Please remember these medications are to assist with weight reduction and diabetes control.  They do not take the place of healthy eating and regular physical activity. There are several GLP-1 medications that can help with weight reduction and diabetes control.  GLP-1 medications with indication for diabetes-Trulicity, Victoza, Bydureon , Mounjaro, Ozempic, and Rybelsus (although these are injected medicines except for Rybelsus which is a pill) GLP-1 medications with indications for weight loss-Wegovy, and Saxenda  Mechanism of action  These medications stimulate glucagon peptide receptors.  By doing so it does the following:  1-slows down stomach emptying-essentially food takes longer to go through the stomach and the intestines-this can lessen appetite  2-reduces glucagon secretion from the pancreas-this helps keep blood glucose levels stable between meals  3-increase his insulin release from the pancreas-with diabetics this helps keep blood glucose levels stable  4-promotes the feeling of being full in the brain-encouraged him receptors in the brain received the signal so the brain and body know that it is time to stop eating Benefits of the medication  1-reduced weight-reduction of weight is more significant at higher dosing.  Not as much weight reduction with Rybelsus   A- typically Wegovy at higher doses can assist with significant weight reduction.  2-improved blood glucose levels-with diabetics typically we see a significant drop with A1c when the medication is adjusted appropriately.  3-decrease risk of heart attacks and strokes-individuals with type 2 diabetes are at increased risk of heart attacks and strokes.  These GLP-1 medications can reduce the risk by 22%.  Risk of GLP-1 medications-these are some of the risks.  It is important to talk with your provider in a shared discussion before starting any medication.  Contraindications to GLP-1  medications-in other words who should not take these.  1-individuals with history of thyroid medullary cancer  2-individuals with family history of multiple endocrine neoplasm syndrome type II (MEN 2)  3-individuals with a history of pancreatitis  4-those with a history of severe hypersensitivity or allergy reactions to GLP-1 medications  5-should be avoided in individuals with a history of suicidal attempts or active suicidal ideation.  Cautions include- 1- risk of thyroid C-cell tumors were seen in rats during clinical testing in humans the relevancy of this information has not been determined 2-risk of pancreatitis including fatal and nonfatal hemorrhagic or necrotizing pancreatitis 3-gallbladder disease slightly increased risk of gallstones 4-hypoglycemia-more common with individuals who are on insulin or sulfonylurea such as glipizide 5-acute kidney injury or worsening of chronic renal failure often this is triggered by severe vomiting and diarrhea as side effects to GLP-1. 6-slightly increased risk of diabetic retinopathy complications in patients with type 2 diabetes 7-heart rate increase-slight increase of base heart rate with GLP-1 8-suicidal behavior and ideation has been reported in clinical trials with other weight loss medicines.  If depression occurs with medication or suicidal ideation stop medication immediately notify provider or seek help.  Common side effects include nausea, vomiting, diarrhea, constipation and increased heart rate.  Less common side effects severe abdominal pain-if this occurs notify provider stop medication get tested for pancreatitis. Typically weight loss with GLP-1 medicines can be anywhere between 10% weight loss all the way up to closer to 20% weight loss.  Greater weight loss occurs with the higher dosing.  Increase potential for side effects also increase with higher dosing including nausea.  Typically the weight loss occurs gradually over 1 years time.   In individuals who are no longer able to  take the medication or choose to stop the medication typically regain at least 90% of the weight that they have lost over the next 6 months.   In other words this medication should not be utilized as a as needed weight loss medication.  In other words something a person uses for 5 to 6 months to help him/her lose weight and then stop the medication and "maintain the loss by healthy eating alone".  This study showed that people regain your weight when they stop the medicine.  Currently the scientific view of this medication is becomes a lifelong medication.  That can be a big challenge for many people.  And for many individuals a economic challenge. Full discussion with your provider should occur before starting these medicines.  Should you desire additional detailed discussion we would recommend an office visit to sit down and discuss further.

## 2022-12-28 NOTE — Progress Notes (Addendum)
Subjective:    Patient ID: Andre Ramirez, male    DOB: November 20, 1980, 42 y.o.   MRN: [id number]  HPI The patient comes in today for a wellness visit.  Patient states he is trying to eat healthy but does have a tendency to overeat he does have a few beers per week denies any chest tightness pressure pain shortness of breath tries to stay physically active  A review of their health history was completed.  A review of medications was also completed.  Any needed refills; lisinopril  Eating habits: fair   Falls/  MVA accidents in past few months: no  Regular exercise: strength train, light cardio  Specialist pt sees on regular basis: none  Preventative health issues were discussed.   Additional concerns:     Review of Systems     Objective:   Physical Exam General-in no acute distress Eyes-no discharge Lungs-respiratory rate normal, CTA CV-no murmurs,RRR Extremities skin warm dry no edema Neuro grossly normal Behavior normal, alert        Assessment & Plan:  1. Well adult exam Adult wellness-complete.wellness physical was conducted today. Importance of diet and exercise were discussed in detail.  Importance of stress reduction and healthy living were discussed.  In addition to this a discussion regarding safety was also covered.  We also reviewed over immunizations and gave recommendations regarding current immunization needed for age.   In addition to this additional areas were also touched on including: Preventative health exams needed:  Colonoscopy not indicated  Patient was advised yearly wellness exam  - Basic Metabolic Panel - Microalbumin/Creatinine Ratio, Urine - Ferritin - Hepatitis B surface antigen - Hepatitis C Antibody - ALT  2. Primary hypertension HTN- patient seen for follow-up regarding HTN.   Diet, medication compliance, appropriate labs and refills were completed.   Importance of keeping blood pressure under good control to lessen  the risk of complications discussed Regular follow-up visits discussed  - Basic Metabolic Panel - Microalbumin/Creatinine Ratio, Urine  3. Elevated transaminase level Cut back on alcohol stay physically active try to bring weight down more than likely fatty liver - Hepatitis B surface antigen - ALT  4. Elevated serum creatinine Check lab work await results - Basic Metabolic Panel - Microalbumin/Creatinine Ratio, Urine  5. Encounter for hepatitis C screening test for low risk patient Labs - Hepatitis C Antibody  Will try to get GLP-1 approved for weight reduction he has BMI of 32+ also fatty liver hypertension hyperlipidemia increased risk for metabolic disease side effects of medication discussed in detail patient is on board with starting medicine  When his lab work comes back we will initiate GLP-1 process Patient states his wife is on the same insurance and she was able to get [facility] approved Side effects were discussed in detail and he was given a printout with his after visit summary  Patient relates that he has been on a documented exercise program over the past 6 months along with healthy eating and trying to cut back on carbohydrates and calories but still having a very difficult time losing weight.  He states that he will send [place] documentation through the EMR regarding his regimented plan.

## 2022-12-31 ENCOUNTER — Telehealth: Payer: Self-pay | Admitting: Family Medicine

## 2022-12-31 ENCOUNTER — Encounter: Payer: Self-pay | Admitting: Family Medicine

## 2022-12-31 NOTE — Progress Notes (Signed)
Please send a copy of the letter from the insurance company with this letter to the patient thank you This will be forwarded to you with the sticky note

## 2022-12-31 NOTE — Telephone Encounter (Signed)
Nurses Please let the patient know that his recent prescription for Wegovy ran into issues with the insurance company.  They have stipulations they have and they have placed into a fact that require a person to be in a structured weight loss management program for at least 3 months before this medication can be prescribed and filled.  I will be sending him a letter regarding this.  This is a stipulation by [employer] that we cannot control.  Also we cannot submit that he has been part of this type of program without proper documentation from the patient-in other words we just cannot fib about this  He can read over the letter and decide how he would like to move forward

## 2023-01-01 ENCOUNTER — Encounter: Payer: Self-pay | Admitting: Family Medicine

## 2023-01-03 NOTE — Telephone Encounter (Signed)
Called patient, number is unavailable, my chart message sent

## 2023-01-20 NOTE — Telephone Encounter (Signed)
Nurses I printed off what he said Please feel free to forward this to his insurance company to see if it meets their criteria If it does not he will have to go through a more formal approach Thank you  Please keep Andre Ramirez updated with the fact that we have submitted this thank you

## 2023-01-24 NOTE — Telephone Encounter (Signed)
Message sent to clinical pharmacist

## 2023-01-25 ENCOUNTER — Telehealth: Payer: Self-pay | Admitting: Pharmacist

## 2023-01-25 ENCOUNTER — Encounter: Payer: Self-pay | Admitting: *Deleted

## 2023-01-25 ENCOUNTER — Encounter: Payer: Self-pay | Admitting: Pharmacist

## 2023-01-25 NOTE — Telephone Encounter (Signed)
Approved Please let patient know Depending on insurance--patient will need follow with PCP in 3-6 months to document a 3-5% weight loss to continue on medication

## 2023-01-25 NOTE — Telephone Encounter (Signed)
Message sent to clinical pharmacist

## 2023-01-25 NOTE — Telephone Encounter (Signed)
Andre Ramirez did document additional addendum in his note please send in addended note from end of August along with the printout of the PDF that he sent through MyChart to his insurance company we will see if this qualifies him to get the medication if not he will have to go through a different program that would meet their criteria

## 2023-01-25 NOTE — Telephone Encounter (Signed)
Nurses [employer] approved and may send in for 0.25 once per week for the first 4 weeks after that 0.5 mg once a week for the next 4 weeks Patient should send [place] a MyChart update within 4 to 6 weeks letting us know how he is doing with this if he is having any trouble stop medicine and notify us Mandatory follow-up office visit in 12 to 14 weeks to document weight loss please inform patient that insurance companies strictly look at how successful is the weight loss if it is not successful they will stop paying for the medicine.  He should record his weights on a regular basis.  But please be aware that the insurance company will look at his last weight with [place] and then look at how his weight is doing when he follows up in approximately 3 to 3-1/2 months he should go ahead and schedule that appointment now so he will have it on the books

## 2023-02-20 ENCOUNTER — Encounter: Payer: Self-pay | Admitting: Family Medicine

## 2023-02-20 NOTE — Telephone Encounter (Signed)
Nurses For the [facility] may go to 0.5 mg subcutaneous weekly for 4 weeks if he tolerates this well we will go up to 1 mg patient to give [place] feedback within 3 to 4 weeks  Please also encourage Apolinar Junes to set up a follow-up office visit in 3 months He will send [place] update in a few weeks time

## 2023-02-22 MED ORDER — WEGOVY 0.5 MG/0.5ML ~~LOC~~ SOAJ: 0.5000 mg | SUBCUTANEOUS | 0 refills | Status: DC

## 2023-02-27 ENCOUNTER — Other Ambulatory Visit (HOSPITAL_COMMUNITY): Payer: Self-pay

## 2023-02-27 MED ORDER — SEMAGLUTIDE-WEIGHT MANAGEMENT 0.5 MG/0.5ML ~~LOC~~ SOAJ
0.5000 mg | SUBCUTANEOUS | 0 refills | Status: DC
  Filled 2023-02-27: qty 2, 28d supply, fill #0

## 2023-03-20 ENCOUNTER — Encounter: Payer: Self-pay | Admitting: Family Medicine

## 2023-03-21 NOTE — Telephone Encounter (Signed)
Nurses Please send in Wegovy 0.5 Once weekly 1 month supply with 2 additional refills   Andre Ramirez can give [place] update on how things are going in 3 to 4 weeks and whether or not he is interested in increasing the dose at that time Continue healthy eating and regular activity  Patient to continue to try to eat healthy stay active and he should follow his weight on a weekly basis and feel free to send [place] updates at that time thank you he has a follow-up office visit in January to keep this thank you

## 2023-03-23 ENCOUNTER — Other Ambulatory Visit (HOSPITAL_COMMUNITY): Payer: Self-pay

## 2023-03-23 ENCOUNTER — Other Ambulatory Visit: Payer: Self-pay

## 2023-03-23 ENCOUNTER — Other Ambulatory Visit: Payer: Self-pay | Admitting: Family Medicine

## 2023-03-23 MED ORDER — SEMAGLUTIDE-WEIGHT MANAGEMENT 0.5 MG/0.5ML ~~LOC~~ SOAJ
0.5000 mg | SUBCUTANEOUS | 4 refills | Status: DC
  Filled 2023-03-23: qty 2, 28d supply, fill #0

## 2023-03-24 ENCOUNTER — Other Ambulatory Visit (HOSPITAL_COMMUNITY): Payer: Self-pay

## 2023-03-27 ENCOUNTER — Other Ambulatory Visit (HOSPITAL_COMMUNITY): Payer: Self-pay

## 2023-04-17 NOTE — Telephone Encounter (Signed)
Nurses May go ahead and go up to the 1 mg Wegovy once per week with 1 month refill with 3 additional refills May go ahead and send the send but refills are often predicated on when the last refill was  Hopefully the new insurance will not be a major issue but the difficult part of all of this is that sometimes new policies will have drug exclusions-no way to know until the next prescription occurs in the new year  We will see you at your follow-up visit in January TakeCare

## 2023-04-18 ENCOUNTER — Other Ambulatory Visit: Payer: Self-pay

## 2023-04-18 MED ORDER — SEMAGLUTIDE-WEIGHT MANAGEMENT 0.5 MG/0.5ML ~~LOC~~ SOAJ: 1.0000 mg | SUBCUTANEOUS | 3 refills | Status: DC

## 2023-04-20 ENCOUNTER — Other Ambulatory Visit: Payer: Self-pay | Admitting: Family Medicine

## 2023-04-20 ENCOUNTER — Other Ambulatory Visit: Payer: Self-pay

## 2023-04-20 ENCOUNTER — Other Ambulatory Visit (HOSPITAL_COMMUNITY): Payer: Self-pay

## 2023-04-20 MED ORDER — SEMAGLUTIDE-WEIGHT MANAGEMENT 0.5 MG/0.5ML ~~LOC~~ SOAJ
1.0000 mg | SUBCUTANEOUS | 3 refills | Status: DC
  Filled 2023-04-20: qty 2, 28d supply, fill #0

## 2023-04-20 MED ORDER — WEGOVY 1 MG/0.5ML ~~LOC~~ SOAJ
1.0000 mg | SUBCUTANEOUS | 6 refills | Status: DC
  Filled 2023-04-20: qty 2, 28d supply, fill #0
  Filled 2023-04-24: qty 2, 28d supply, fill #1
  Filled 2023-05-31: qty 2, 28d supply, fill #2
  Filled 2023-07-05: qty 2, 28d supply, fill #3

## 2023-04-24 ENCOUNTER — Other Ambulatory Visit (HOSPITAL_COMMUNITY): Payer: Self-pay

## 2023-05-03 ENCOUNTER — Other Ambulatory Visit: Payer: Self-pay | Admitting: Family Medicine

## 2023-05-16 ENCOUNTER — Ambulatory Visit: Payer: 59 | Admitting: Family Medicine

## 2023-05-16 VITALS — BP 114/80 | HR 68 | Temp 97.6°F | Ht 70.0 in | Wt 201.8 lb

## 2023-05-16 DIAGNOSIS — E663 Overweight: Secondary | ICD-10-CM

## 2023-05-16 DIAGNOSIS — E785 Hyperlipidemia, unspecified: Secondary | ICD-10-CM | POA: Diagnosis not present

## 2023-05-16 DIAGNOSIS — R7989 Other specified abnormal findings of blood chemistry: Secondary | ICD-10-CM | POA: Diagnosis not present

## 2023-05-16 DIAGNOSIS — R7401 Elevation of levels of liver transaminase levels: Secondary | ICD-10-CM

## 2023-05-16 DIAGNOSIS — Z79899 Other long term (current) drug therapy: Secondary | ICD-10-CM

## 2023-05-16 DIAGNOSIS — I10 Essential (primary) hypertension: Secondary | ICD-10-CM

## 2023-05-16 NOTE — Progress Notes (Signed)
   Subjective:    Patient ID: Andre Ramirez, male    DOB: 1981/05/01, 43 y.o.   MRN: [id number]  HPI Patient has obesity along with hypertension.  Patient also had elevated liver transaminase with presumed fatty liver.  He has had a difficult time keeping his weight down just by activity alone.  He has been on Wegovy  for several months.  He has seen significant weight loss was well over 25 now currently 201. Patient states he tolerates medicine well states it does cut down on his appetite and allows him to do a better job of less snacking and regular physical activity  Patient for blood pressure check up.  The patient does have hypertension.   Patient relates dietary measures try to minimize salt The importance of healthy diet and activity were discussed Patient relates compliance   Review of Systems     Objective:   Physical Exam General-in no acute distress Eyes-no discharge Lungs-respiratory rate normal, CTA CV-no murmurs,RRR Extremities skin warm dry no edema Neuro grossly normal Behavior normal, alert        Assessment & Plan:  1. Elevated transaminase level (Primary) Previous elevated transaminase-recheck liver profile  2. Elevated serum creatinine Previous lab work elevated creatinine, patient to repeat lab work to do this when well-hydrated  3. Hyperlipidemia, unspecified hyperlipidemia type Lipid profile fasting healthy diet - Lipid Panel  4. Overweight (BMI 25.0-29.9) Wegovy  is helping Weight has come down over 25 pounds, continue Wegovy  at 1.0.  Patient trying to lose down to approximately 180 which will help his overall health including blood pressure fatty liver and hyperlipidemia  5. Primary hypertension Blood pressure good control continue current measures for now - Basic Metabolic Panel - Microalbumin/Creatinine Ratio, Urine  6. High risk medication use Check labs - Hepatic Function Panel  Patient to monitor his weight on a weekly basis patient  to send us  an update preferably every 4 to 6 weeks on how his weights are doing we can increase Wegovy  if his weight loss is plateaued  Patient will follow-up within 6 months

## 2023-05-23 ENCOUNTER — Encounter: Payer: Self-pay | Admitting: Family Medicine

## 2023-05-24 NOTE — Telephone Encounter (Signed)
Nurses I appreciate the update from the patient Please communicate with patient whether or not information has been received and what the patient needs to do from this point forward thank you  Then we can move forward regarding getting the prior approval Please move forward with handling this and keeping the patient informed thank you

## 2023-05-31 ENCOUNTER — Other Ambulatory Visit (HOSPITAL_COMMUNITY): Payer: Self-pay

## 2023-06-05 ENCOUNTER — Encounter: Payer: Self-pay | Admitting: Family Medicine

## 2023-06-06 ENCOUNTER — Ambulatory Visit
Admission: RE | Admit: 2023-06-06 | Discharge: 2023-06-06 | Disposition: A | Discharge status: Home / Self Care | Payer: 59 | Source: Ambulatory Visit | Attending: Orthopaedic Surgery | Admitting: Orthopaedic Surgery

## 2023-06-06 ENCOUNTER — Encounter (HOSPITAL_BASED_OUTPATIENT_CLINIC_OR_DEPARTMENT_OTHER): Payer: Self-pay | Admitting: Orthopaedic Surgery

## 2023-06-06 ENCOUNTER — Other Ambulatory Visit: Payer: Self-pay | Admitting: Orthopaedic Surgery

## 2023-06-06 ENCOUNTER — Other Ambulatory Visit: Payer: Self-pay

## 2023-06-06 ENCOUNTER — Encounter: Payer: Self-pay | Admitting: Orthopaedic Surgery

## 2023-06-06 DIAGNOSIS — S8252XA Displaced fracture of medial malleolus of left tibia, initial encounter for closed fracture: Secondary | ICD-10-CM

## 2023-06-06 NOTE — Progress Notes (Signed)
   06/06/23 1314  PAT Phone Screen  Is the patient taking a GLP-1 receptor agonist? (S)  Yes (2/2 last dose. will hold dose on 2/9)  Has the patient been informed on holding medication? (S)  Yes  Do You Have Diabetes? No  Do You Have Hypertension? Yes  Have You Ever Been to the ER for Asthma? No  Have You Taken Oral Steroids in the Past 3 Months? No  Do you Take Phenteramine or any Other Diet Drugs? No  Recent  Lab Work, EKG, CXR? No  Do you have a history of heart problems? No  Any Recent Hospitalizations? No  Height 5' 10 (1.778 m)  Weight 88.5 kg  Pat Appointment Scheduled Yes (EKG)

## 2023-06-09 ENCOUNTER — Encounter (HOSPITAL_BASED_OUTPATIENT_CLINIC_OR_DEPARTMENT_OTHER)
Admission: RE | Admit: 2023-06-09 | Discharge: 2023-06-09 | Disposition: A | Discharge status: Home / Self Care | Payer: 59 | Source: Ambulatory Visit | Attending: Orthopaedic Surgery

## 2023-06-09 DIAGNOSIS — Z01812 Encounter for preprocedural laboratory examination: Secondary | ICD-10-CM | POA: Insufficient documentation

## 2023-06-11 NOTE — H&P (Signed)
 ORTHOPAEDIC SURGERY H&P  Subjective:  The patient presents with left ankle fx.   Past Medical History:  Diagnosis Date   Anxiety    HLD (hyperlipidemia)    Hypertension     Past Surgical History:  Procedure Laterality Date   WISDOM TOOTH EXTRACTION       (Not in an outpatient encounter)    Allergies  Allergen Reactions   Sertraline      Side effects    Social History   Socioeconomic History   Marital status: Married    Spouse name: Maegan   Number of children: Not on file   Years of education: Not on file   Highest education level: Not on file  Occupational History   Not on file  Tobacco Use   Smoking status: Never   Smokeless tobacco: Never  Vaping Use   Vaping status: Never Used  Substance and Sexual Activity   Alcohol use: Never   Drug use: Never   Sexual activity: Not on file  Other Topics Concern   Not on file  Social History Narrative   Not on file   Social Drivers of Health   Financial Resource Strain: Not on file  Food Insecurity: Not on file  Transportation Needs: Not on file  Physical Activity: Not on file  Stress: Not on file  Social Connections: Not on file  Intimate Partner Violence: Not on file     History reviewed. No pertinent family history.   Review of Systems Pertinent items are noted in HPI.  Objective: Vital signs in last 24 hours:    06/06/2023    1:14 PM 05/16/2023    9:43 AM 05/16/2023    9:11 AM  Vitals with BMI  Height 5' 10  5' 10  Weight 195 lbs  201 lbs 13 oz  BMI 27.98  28.96  Systolic  114 125  Diastolic  80 87  Pulse   68      EXAM: General: Well nourished, well developed. Awake, alert and oriented to time, place, person. Normal mood and affect. No apparent distress. Breathing room air.  Operative Lower Extremity: Alignment - Neutral Deformity - None Skin intact Tenderness to palpation - left ankle  5/5 TA, PT, GS, Per, EHL, FHL Sensation intact to light touch throughout Palpable DP and PT  pulses Special testing: None  The contralateral foot/ankle was examined for comparison and noted to be neurovascularly intact with no localized deformity, swelling, or tenderness.  Imaging Review All images taken were independently reviewed by me.  Assessment/Plan: The clinical and radiographic findings were reviewed and discussed at length with the patient.  The patient has left ankle fx.  We spoke at length about the natural course of these findings. We discussed nonoperative and operative treatment options in detail.  The risks and benefits were presented and reviewed. The risks due to hardware failure/irritation, new/persistent/recurrent infection, stiffness, nerve/vessel/tendon injury, nonunion/malunion of any fracture, wound healing issues, allograft usage, development of arthritis, failure of this surgery, possibility of external fixation in certain situations, possibility of delayed definitive surgery, need for further surgery, prolonged wound care including further soft tissue coverage procedures, thromboembolic events, anesthesia/medical complications/events perioperatively and beyond, amputation, death among others were discussed. The patient acknowledged the explanation and agreed to proceed with the plan.  Lillia Mountain  Orthopaedic Surgery [facility]

## 2023-06-11 NOTE — Discharge Instructions (Addendum)
 Netta Cedars, MD [facility]  Please read the following information regarding your care after surgery.  Medications  You only need a prescription for the narcotic pain medicine (ex. oxycodone, Percocet, Norco).  All of the other medicines listed below are available over the counter. ? Aleve 2 pills twice a day for the first 3 days after surgery. ? acetominophen (Tylenol) 650 mg every 4-6 hours as you need for minor to moderate pain ? oxycodone as prescribed for severe pain  ? To help prevent blood clots, take aspirin (81 mg) twice daily for at least 42 days after surgery.  You should also get up every hour while you are awake to move around.  Weight Bearing ? Do NOT bear any weight on the operated leg or foot. This means do NOT touch your surgical leg to the ground!  Cast / Splint / Dressing ? If you have a splint, do NOT remove this. Keep your splint, cast or dressing clean and dry.  Don't put anything (coat hanger, pencil, etc) down inside of it.  If it gets wet, call the office immediately to schedule an appointment for a cast change.  Swelling IMPORTANT: It is normal for you to have swelling where you had surgery. To reduce swelling and pain, keep at least 3 pillows under your leg so that your toes are above your nose and your heel is above the level of your hip.  It may be necessary to keep your foot or leg elevated for several weeks.  This is critical to helping your incisions heal and your pain to feel better.  Follow Up Call my office at [phone] when you are discharged from the hospital or surgery center to schedule an appointment to be seen 7-10 days after surgery.  Call my office at [phone] if you develop a fever >101.5 F, nausea, vomiting, bleeding from the surgical site or severe pain.     Post Anesthesia Home Care Instructions  Activity: Get plenty of rest for the remainder of the day. A responsible individual must stay with you for 24 hours following the  procedure.  For the next 24 hours, DO NOT: -Drive a car -Advertising copywriter -Drink alcoholic beverages -Take any medication unless instructed by your physician -Make any legal decisions or sign important papers.  Meals: Start with liquid foods such as gelatin or soup. Progress to regular foods as tolerated. Avoid greasy, spicy, heavy foods. If nausea and/or vomiting occur, drink only clear liquids until the nausea and/or vomiting subsides. Call your physician if vomiting continues.  Special Instructions/Symptoms: Your throat may feel dry or sore from the anesthesia or the breathing tube placed in your throat during surgery. If this causes discomfort, gargle with warm salt water. The discomfort should disappear within 24 hours.  If you had a scopolamine patch placed behind your ear for the management of post- operative nausea and/or vomiting:  1. The medication in the patch is effective for 72 hours, after which it should be removed.  Wrap patch in a tissue and discard in the trash. Wash hands thoroughly with soap and water. 2. You may remove the patch earlier than 72 hours if you experience unpleasant side effects which may include dry mouth, dizziness or visual disturbances. 3. Avoid touching the patch. Wash your hands with soap and water after contact with the patch.  Regional Anesthesia Blocks  1. You may not be able to move or feel the "blocked" extremity after a regional anesthetic block. This may last may last  from 3-48 hours after placement, but it will go away. The length of time depends on the medication injected and your individual response to the medication. As the nerves start to wake up, you may experience tingling as the movement and feeling returns to your extremity. If the numbness and inability to move your extremity has not gone away after 48 hours, please call your surgeon.   2. The extremity that is blocked will need to be protected until the numbness is gone and the  strength has returned. Because you cannot feel it, you will need to take extra care to avoid injury. Because it may be weak, you may have difficulty moving it or using it. You may not know what position it is in without looking at it while the block is in effect.  3. For blocks in the legs and feet, returning to weight bearing and walking needs to be done carefully. You will need to wait until the numbness is entirely gone and the strength has returned. You should be able to move your leg and foot normally before you try and bear weight or walk. You will need someone to be with you when you first try to ensure you do not fall and possibly risk injury.  4. Bruising and tenderness at the needle site are common side effects and will resolve in a few days.  5. Persistent numbness or new problems with movement should be communicated to the surgeon or the [facility] [phone] [facility] [phone]).

## 2023-06-12 ENCOUNTER — Encounter (HOSPITAL_BASED_OUTPATIENT_CLINIC_OR_DEPARTMENT_OTHER): Payer: Self-pay | Admitting: Orthopaedic Surgery

## 2023-06-12 ENCOUNTER — Ambulatory Visit (HOSPITAL_BASED_OUTPATIENT_CLINIC_OR_DEPARTMENT_OTHER)
Admission: RE | Admit: 2023-06-12 | Discharge: 2023-06-12 | Disposition: A | Discharge status: Home / Self Care | Payer: 59 | Attending: Orthopaedic Surgery | Admitting: Orthopaedic Surgery

## 2023-06-12 ENCOUNTER — Ambulatory Visit (HOSPITAL_BASED_OUTPATIENT_CLINIC_OR_DEPARTMENT_OTHER): Payer: 59 | Admitting: Anesthesiology

## 2023-06-12 ENCOUNTER — Other Ambulatory Visit: Payer: Self-pay

## 2023-06-12 ENCOUNTER — Encounter (HOSPITAL_BASED_OUTPATIENT_CLINIC_OR_DEPARTMENT_OTHER)
Admission: RE | Disposition: A | Discharge status: Home / Self Care | Payer: Self-pay | Source: Home / Self Care | Attending: Orthopaedic Surgery

## 2023-06-12 ENCOUNTER — Ambulatory Visit (HOSPITAL_COMMUNITY): Payer: 59

## 2023-06-12 ENCOUNTER — Encounter: Payer: Self-pay | Admitting: Family Medicine

## 2023-06-12 ENCOUNTER — Other Ambulatory Visit (HOSPITAL_COMMUNITY): Payer: Self-pay

## 2023-06-12 DIAGNOSIS — S8252XA Displaced fracture of medial malleolus of left tibia, initial encounter for closed fracture: Secondary | ICD-10-CM

## 2023-06-12 DIAGNOSIS — X58XXXA Exposure to other specified factors, initial encounter: Secondary | ICD-10-CM | POA: Diagnosis not present

## 2023-06-12 DIAGNOSIS — I10 Essential (primary) hypertension: Secondary | ICD-10-CM | POA: Diagnosis not present

## 2023-06-12 DIAGNOSIS — S82872A Displaced pilon fracture of left tibia, initial encounter for closed fracture: Secondary | ICD-10-CM | POA: Diagnosis present

## 2023-06-12 HISTORY — PX: ANKLE ARTHROSCOPY: SHX545

## 2023-06-12 HISTORY — DX: Hyperlipidemia, unspecified: E78.5

## 2023-06-12 HISTORY — PX: SYNDESMOSIS REPAIR: SHX5182

## 2023-06-12 HISTORY — PX: ORIF ANKLE FRACTURE: SHX5408

## 2023-06-12 HISTORY — DX: Anxiety disorder, unspecified: F41.9

## 2023-06-12 HISTORY — DX: Essential (primary) hypertension: I10

## 2023-06-12 SURGERY — OPEN REDUCTION INTERNAL FIXATION (ORIF) ANKLE FRACTURE
Anesthesia: Regional | Site: Ankle | Laterality: Left

## 2023-06-12 MED ORDER — ONDANSETRON HCL 4 MG/2ML IJ SOLN
INTRAMUSCULAR | Status: DC | PRN
  Administered 2023-06-12: 4 mg via INTRAVENOUS

## 2023-06-12 MED ORDER — PHENYLEPHRINE 80 MCG/ML (10ML) SYRINGE FOR IV PUSH (FOR BLOOD PRESSURE SUPPORT)
PREFILLED_SYRINGE | INTRAVENOUS | Status: AC
  Filled 2023-06-12: qty 10

## 2023-06-12 MED ORDER — FENTANYL CITRATE (PF) 100 MCG/2ML IJ SOLN
INTRAMUSCULAR | Status: AC
  Filled 2023-06-12: qty 2

## 2023-06-12 MED ORDER — LIDOCAINE 2% (20 MG/ML) 5 ML SYRINGE
INTRAMUSCULAR | Status: AC
Start: 2023-06-12 — End: ?
  Filled 2023-06-12: qty 5

## 2023-06-12 MED ORDER — ONDANSETRON HCL 4 MG/2ML IJ SOLN
INTRAMUSCULAR | Status: AC
  Filled 2023-06-12: qty 2

## 2023-06-12 MED ORDER — BUPIVACAINE LIPOSOME 1.3 % IJ SUSP
INTRAMUSCULAR | Status: DC | PRN
  Administered 2023-06-12: 10 mL via PERINEURAL

## 2023-06-12 MED ORDER — LACTATED RINGERS IV SOLN
INTRAVENOUS | Status: DC
Start: 2023-06-12 — End: 2023-06-12

## 2023-06-12 MED ORDER — CEFAZOLIN SODIUM-DEXTROSE 2-4 GM/100ML-% IV SOLN
2.0000 g | INTRAVENOUS | Status: AC
  Administered 2023-06-12: 2 g via INTRAVENOUS

## 2023-06-12 MED ORDER — PROPOFOL 10 MG/ML IV BOLUS
INTRAVENOUS | Status: DC | PRN
  Administered 2023-06-12: 300 mg via INTRAVENOUS

## 2023-06-12 MED ORDER — CEFAZOLIN SODIUM-DEXTROSE 2-4 GM/100ML-% IV SOLN
INTRAVENOUS | Status: AC
  Filled 2023-06-12: qty 100

## 2023-06-12 MED ORDER — LACTATED RINGERS IV SOLN: INTRAVENOUS | Status: DC | PRN

## 2023-06-12 MED ORDER — POVIDONE-IODINE 10 % EX SOLN
CUTANEOUS | Status: DC | PRN
  Administered 2023-06-12: 1 via TOPICAL

## 2023-06-12 MED ORDER — MIDAZOLAM HCL 5 MG/5ML IJ SOLN
INTRAMUSCULAR | Status: DC | PRN
  Administered 2023-06-12: 2 mg via INTRAVENOUS

## 2023-06-12 MED ORDER — DEXMEDETOMIDINE HCL IN NACL 80 MCG/20ML IV SOLN
INTRAVENOUS | Status: DC | PRN
  Administered 2023-06-12: 12 ug via INTRAVENOUS

## 2023-06-12 MED ORDER — 0.9 % SODIUM CHLORIDE (POUR BTL) OPTIME
TOPICAL | Status: DC | PRN
  Administered 2023-06-12: 1000 mL

## 2023-06-12 MED ORDER — FENTANYL CITRATE (PF) 100 MCG/2ML IJ SOLN
100.0000 ug | Freq: Once | INTRAMUSCULAR | Status: AC
  Administered 2023-06-12: 100 ug via INTRAVENOUS

## 2023-06-12 MED ORDER — ROPIVACAINE HCL 7.5 MG/ML IJ SOLN
INTRAMUSCULAR | Status: DC | PRN
  Administered 2023-06-12: 12 mL via PERINEURAL

## 2023-06-12 MED ORDER — MIDAZOLAM HCL 2 MG/2ML IJ SOLN
INTRAMUSCULAR | Status: AC
  Filled 2023-06-12: qty 2

## 2023-06-12 MED ORDER — DEXAMETHASONE SODIUM PHOSPHATE 10 MG/ML IJ SOLN
INTRAMUSCULAR | Status: DC | PRN
  Administered 2023-06-12: 5 mg via INTRAVENOUS

## 2023-06-12 MED ORDER — KETOROLAC TROMETHAMINE 30 MG/ML IJ SOLN
INTRAMUSCULAR | Status: DC | PRN
  Administered 2023-06-12: 30 mg via INTRAVENOUS

## 2023-06-12 MED ORDER — MIDAZOLAM HCL 2 MG/2ML IJ SOLN
2.0000 mg | Freq: Once | INTRAMUSCULAR | Status: AC
  Administered 2023-06-12: 2 mg via INTRAVENOUS

## 2023-06-12 MED ORDER — VANCOMYCIN HCL 500 MG IV SOLR
INTRAVENOUS | Status: DC | PRN
  Administered 2023-06-12: 500 mg via TOPICAL

## 2023-06-12 MED ORDER — FENTANYL CITRATE (PF) 100 MCG/2ML IJ SOLN
INTRAMUSCULAR | Status: DC | PRN
  Administered 2023-06-12 (×2): 50 ug via INTRAVENOUS

## 2023-06-12 MED ORDER — KETOROLAC TROMETHAMINE 30 MG/ML IJ SOLN
INTRAMUSCULAR | Status: AC
  Filled 2023-06-12: qty 1

## 2023-06-12 MED ORDER — PHENYLEPHRINE 80 MCG/ML (10ML) SYRINGE FOR IV PUSH (FOR BLOOD PRESSURE SUPPORT)
PREFILLED_SYRINGE | INTRAVENOUS | Status: DC | PRN
  Administered 2023-06-12: 160 ug via INTRAVENOUS

## 2023-06-12 MED ORDER — CHLORHEXIDINE GLUCONATE 4 % EX SOLN: 60.0000 mL | Freq: Once | CUTANEOUS | Status: DC

## 2023-06-12 MED ORDER — DEXAMETHASONE SODIUM PHOSPHATE 10 MG/ML IJ SOLN
INTRAMUSCULAR | Status: AC
  Filled 2023-06-12: qty 1

## 2023-06-12 MED ORDER — BUPIVACAINE-EPINEPHRINE (PF) 0.5% -1:200000 IJ SOLN
INTRAMUSCULAR | Status: DC | PRN
  Administered 2023-06-12: 15 mL via PERINEURAL

## 2023-06-12 SURGICAL SUPPLY — 90 items
BANDAGE ESMARK 6X9 LF (GAUZE/BANDAGES/DRESSINGS) ×1 IMPLANT
BIT DRILL 2 LNG CALIBR (DRILL) IMPLANT
BIT DRILL 2.5 CANN STRL (BIT) IMPLANT
BLADE AVERAGE 25X9 (BLADE) IMPLANT
BLADE SURG 15 STRL LF DISP TIS (BLADE) ×4 IMPLANT
BNDG COHESIVE 4X5 TAN STRL LF (GAUZE/BANDAGES/DRESSINGS) ×2 IMPLANT
BNDG ELASTIC 4INX 5YD STR LF (GAUZE/BANDAGES/DRESSINGS) IMPLANT
BNDG ELASTIC 6INX 5YD STR LF (GAUZE/BANDAGES/DRESSINGS) IMPLANT
BNDG ELASTIC 6X10 VLCR STRL LF (GAUZE/BANDAGES/DRESSINGS) ×1 IMPLANT
BNDG ESMARK 6X9 LF (GAUZE/BANDAGES/DRESSINGS)
BNDG GAUZE DERMACEA FLUFF 4 (GAUZE/BANDAGES/DRESSINGS) ×1 IMPLANT
BRUSH SCRUB EZ 4% CHG (MISCELLANEOUS) ×1 IMPLANT
CANISTER SUCT 1200ML W/VALVE (MISCELLANEOUS) ×1 IMPLANT
CHLORAPREP W/TINT 26 (MISCELLANEOUS) ×1 IMPLANT
COVER BACK TABLE 60X90IN (DRAPES) ×1 IMPLANT
CUFF TRNQT CYL 34X4.125X (TOURNIQUET CUFF) ×1 IMPLANT
DRAPE C-ARM 42X72 X-RAY (DRAPES) ×1 IMPLANT
DRAPE C-ARMOR (DRAPES) ×1 IMPLANT
DRAPE EXTREMITY T 121X128X90 (DISPOSABLE) ×1 IMPLANT
DRAPE IMP U-DRAPE 54X76 (DRAPES) ×1 IMPLANT
DRAPE OEC MINIVIEW 54X84 (DRAPES) IMPLANT
DRAPE U-SHAPE 47X51 STRL (DRAPES) ×1 IMPLANT
DRSG MEPITEL 4X7.2 (GAUZE/BANDAGES/DRESSINGS) ×1 IMPLANT
ELECT REM PT RETURN 9FT ADLT (ELECTROSURGICAL) ×1
ELECTRODE REM PT RTRN 9FT ADLT (ELECTROSURGICAL) ×1 IMPLANT
GAUZE PAD ABD 8X10 STRL (GAUZE/BANDAGES/DRESSINGS) ×5 IMPLANT
GAUZE SPONGE 4X4 12PLY STRL (GAUZE/BANDAGES/DRESSINGS) ×1 IMPLANT
GLOVE BIOGEL PI IND STRL 8 (GLOVE) ×2 IMPLANT
GLOVE SURG SS PI 7.5 STRL IVOR (GLOVE) ×2 IMPLANT
GOWN STRL REUS W/ TWL LRG LVL3 (GOWN DISPOSABLE) ×2 IMPLANT
GUIDEWIRE 1.35MM (WIRE) IMPLANT
KIT FIBERTAK DX 1.6 DISP (KITS) IMPLANT
MARKER SKIN DUAL TIP RULER LAB (MISCELLANEOUS) IMPLANT
NANONEEDLE HIGHFLOW SHEATH 125 (SHEATH) ×1
NDL HYPO 22X1.5 SAFETY MO (MISCELLANEOUS) IMPLANT
NDL HYPO 25X1 1.5 SAFETY (NEEDLE) IMPLANT
NDL SAFETY ECLIPSE 18X1.5 (NEEDLE) ×1 IMPLANT
NDL SUT 6 .5 CRC .975X.05 MAYO (NEEDLE) IMPLANT
NEEDLE HYPO 22X1.5 SAFETY MO (MISCELLANEOUS) IMPLANT
NEEDLE HYPO 25X1 1.5 SAFETY (NEEDLE) IMPLANT
NS IRRIG 1000ML POUR BTL (IV SOLUTION) ×1 IMPLANT
PACK BASIN DAY SURGERY FS (CUSTOM PROCEDURE TRAY) ×1 IMPLANT
PAD CAST 4YDX4 CTTN HI CHSV (CAST SUPPLIES) ×1 IMPLANT
PADDING CAST ABS COTTON 4X4 ST (CAST SUPPLIES) IMPLANT
PADDING CAST SYNTHETIC 4X4 STR (CAST SUPPLIES) ×2 IMPLANT
PADDING CAST SYNTHETIC 6X4 NS (CAST SUPPLIES) ×2 IMPLANT
PASSER SUT SWANSON 36MM LOOP (INSTRUMENTS) IMPLANT
PENCIL SMOKE EVACUATOR (MISCELLANEOUS) ×1 IMPLANT
PLATE LOCK LAT 72 3H (Plate) IMPLANT
PLATE LOCK THIRD TUBULAR 4H (Plate) IMPLANT
RETRIEVER SUT HEWSON (MISCELLANEOUS) IMPLANT
SANITIZER HAND PURELL FF 515ML (MISCELLANEOUS) ×1 IMPLANT
SCOPE NANONDL 125 (MISCELLANEOUS) IMPLANT
SCOPE NANONEEDLE 125 (MISCELLANEOUS) ×1 IMPLANT
SCREW BONE KREULOCK 3.5X14 TI (Screw) IMPLANT
SCREW CORT TI FT 3.5X38 (Screw) IMPLANT
SCREW KREULOCK 3.0X10 (Screw) IMPLANT
SET IRRIG Y TYPE TUR BLADDER L (SET/KITS/TRAYS/PACK) IMPLANT
SHAVER SABRE 2.0 (BURR) IMPLANT
SHEATH NANONDL HIGHFLOW 125 (SHEATH) IMPLANT
SHEATH NANONEEDLE HIGHFLOW 125 (SHEATH) ×1 IMPLANT
SHEET MEDIUM DRAPE 40X70 STRL (DRAPES) ×1 IMPLANT
SLEEVE SCD COMPRESS KNEE MED (STOCKING) ×1 IMPLANT
SPIKE FLUID TRANSFER (MISCELLANEOUS) IMPLANT
SPLINT PLASTER CAST FAST 5X30 (CAST SUPPLIES) ×20 IMPLANT
SPONGE T-LAP 18X18 ~~LOC~~+RFID (SPONGE) ×1 IMPLANT
STAPLER SKIN PROX WIDE 3.9 (STAPLE) IMPLANT
STOCKINETTE 6 STRL (DRAPES) ×1 IMPLANT
STOCKINETTE ORTHO 6X25 (MISCELLANEOUS) ×1 IMPLANT
SUCTION TUBE FRAZIER 10FR DISP (SUCTIONS) IMPLANT
SUT ETHIBOND 2 OS 4 DA (SUTURE) IMPLANT
SUT ETHILON 2 0 FS 18 (SUTURE) ×2 IMPLANT
SUT FIBERWIRE 2-0 18 17.9 3/8 (SUTURE)
SUT MNCRL AB 3-0 PS2 18 (SUTURE) ×1 IMPLANT
SUT PDS 3-0 CT2 (SUTURE)
SUT PDS II 3-0 CT2 27 ABS (SUTURE) IMPLANT
SUT VIC AB 0 CT1 27XBRD ANBCTR (SUTURE) IMPLANT
SUT VIC AB 2-0 CT1 TAPERPNT 27 (SUTURE) IMPLANT
SUT VIC AB 2-0 SH 27XBRD (SUTURE) ×1 IMPLANT
SUT VIC AB 3-0 SH 27X BRD (SUTURE) IMPLANT
SUT VICRYL 0 SH 27 (SUTURE) IMPLANT
SUTURE FIBERWR 2-0 18 17.9 3/8 (SUTURE) IMPLANT
SYNDESMOSIS TIGHTROPE XP (Orthopedic Implant) IMPLANT
SYR 20ML LL LF (SYRINGE) ×1 IMPLANT
SYR BULB EAR ULCER 3OZ GRN STR (SYRINGE) ×1 IMPLANT
SYR BULB IRRIG 60ML STRL (SYRINGE) ×1 IMPLANT
SYR CONTROL 10ML LL (SYRINGE) IMPLANT
TOWEL GREEN STERILE FF (TOWEL DISPOSABLE) ×2 IMPLANT
TUBE CONNECTING 20X1/4 (TUBING) ×1 IMPLANT
UNDERPAD 30X36 HEAVY ABSORB (UNDERPADS AND DIAPERS) ×1 IMPLANT

## 2023-06-12 NOTE — H&P (Signed)

## 2023-06-12 NOTE — Progress Notes (Signed)
 Assisted Dr. Brain Cahill with left, adductor canal, popliteal, ultrasound guided block. Side rails up, monitors on throughout procedure. See vital signs in flow sheet. Tolerated Procedure well.

## 2023-06-12 NOTE — Anesthesia Procedure Notes (Signed)
 Anesthesia Regional Block: Popliteal block   Pre-Anesthetic Checklist: , timeout performed,  Correct Patient, Correct Site, Correct Laterality,  Correct Procedure, Correct Position, site marked,  Risks and benefits discussed,  Surgical consent,  Pre-op evaluation,  At surgeon's request and post-op pain management  Laterality: Left and Lower  Prep: chloraprep       Needles:  Injection technique: Single-shot      Needle Length: 9cm  Needle Gauge: 22     Additional Needles: Arrow StimuQuik ECHO Echogenic Stimulating PNB Needle  Procedures:,,,, ultrasound used (permanent image in chart),,    Narrative:  Start time: 06/12/2023 11:25 AM End time: 06/12/2023 11:29 AM Injection made incrementally with aspirations every 5 mL.  Performed by: Personally  Anesthesiologist: Arvie Latus, MD

## 2023-06-12 NOTE — Anesthesia Procedure Notes (Signed)
 Procedure Name: LMA Insertion Date/Time: 06/12/2023 11:58 AM  Performed by: Raymona Caldwell, CRNAPre-anesthesia Checklist: Patient identified, Emergency Drugs available, Suction available and Patient being monitored Patient Re-evaluated:Patient Re-evaluated prior to induction Oxygen Delivery Method: Circle system utilized Preoxygenation: Pre-oxygenation with 100% oxygen Induction Type: IV induction Ventilation: Mask ventilation without difficulty LMA: LMA inserted LMA Size: 4.0 Number of attempts: 1 Airway Equipment and Method: Bite block Placement Confirmation: positive ETCO2 Tube secured with: Tape Dental Injury: Teeth and Oropharynx as per pre-operative assessment

## 2023-06-12 NOTE — Anesthesia Preprocedure Evaluation (Signed)
 Anesthesia Evaluation  Patient identified by MRN, date of birth, ID band Patient awake    Reviewed: Allergy & Precautions, NPO status , Patient's Chart, lab work & pertinent test results  History of Anesthesia Complications Negative for: history of anesthetic complications  Airway Mallampati: I  TM Distance: >3 FB Neck ROM: Full    Dental  (+) Teeth Intact, Dental Advisory Given   Pulmonary neg shortness of breath, neg sleep apnea, neg COPD, neg recent URI   breath sounds clear to auscultation       Cardiovascular hypertension, Pt. on medications  Rhythm:Regular     Neuro/Psych  PSYCHIATRIC DISORDERS Anxiety     negative neurological ROS     GI/Hepatic negative GI ROS, Neg liver ROS,,,  Endo/Other  negative endocrine ROS  No results found for: "HGBA1C"   Renal/GU negative Renal ROS     Musculoskeletal Left ankle fx   Abdominal   Peds  Hematology negative hematology ROS (+)   Anesthesia Other Findings   Reproductive/Obstetrics                             Anesthesia Physical Anesthesia Plan  ASA: 2  Anesthesia Plan: General and Regional   Post-op Pain Management: Regional block*   Induction: Intravenous  PONV Risk Score and Plan: 2 and Ondansetron  and Dexamethasone   Airway Management Planned: LMA  Additional Equipment: None  Intra-op Plan:   Post-operative Plan: Extubation in OR  Informed Consent: I have reviewed the patients History and Physical, chart, labs and discussed the procedure including the risks, benefits and alternatives for the proposed anesthesia with the patient or authorized representative who has indicated his/her understanding and acceptance.     Dental advisory given  Plan Discussed with: CRNA  Anesthesia Plan Comments:        Anesthesia Quick Evaluation

## 2023-06-12 NOTE — Anesthesia Procedure Notes (Signed)
 Anesthesia Regional Block: Adductor canal block   Pre-Anesthetic Checklist: , timeout performed,  Correct Patient, Correct Site, Correct Laterality,  Correct Procedure, Correct Position, site marked,  Risks and benefits discussed,  Surgical consent,  Pre-op evaluation,  At surgeon's request and post-op pain management  Laterality: Left and Lower  Prep: chloraprep       Needles:  Injection technique: Single-shot      Needle Length: 9cm  Needle Gauge: 22     Additional Needles: Arrow StimuQuik ECHO Echogenic Stimulating PNB Needle  Procedures:,,,, ultrasound used (permanent image in chart),,    Narrative:  Start time: 06/12/2023 11:21 AM End time: 06/12/2023 11:24 AM Injection made incrementally with aspirations every 5 mL.  Performed by: Personally  Anesthesiologist: Arvie Latus, MD

## 2023-06-12 NOTE — Transfer of Care (Signed)
 Immediate Anesthesia Transfer of Care Note  Patient: Andre Ramirez  Procedure(s) Performed: OPEN REDUCTION INTERNAL FIXATION (ORIF) MEDIAL MALLEOLUS ANKLE FRACTURE (Left: Ankle) SYNDESMOSIS REPAIR (Left: Ankle) ANKLE ARTHROSCOPY WITH NANOSCOPE (Left: Ankle)  Patient Location: PACU  Anesthesia Type:GA combined with regional for post-op pain  Level of Consciousness: drowsy  Airway & Oxygen Therapy: Patient Spontanous Breathing and Patient connected to face mask oxygen  Post-op Assessment: Report given to RN and Post -op Vital signs reviewed and stable  Post vital signs: Reviewed and stable  Last Vitals:  Vitals Value Taken Time  BP 98/64 06/12/23 1315  Temp    Pulse 83 06/12/23 1317  Resp 0 06/12/23 1317  SpO2 95 % 06/12/23 1317  Vitals shown include unfiled device data.  Last Pain:  Vitals:   06/12/23 0917  TempSrc: Temporal  PainSc: 0-No pain      Patients Stated Pain Goal: 3 (06/12/23 0917)  Complications: No notable events documented.

## 2023-06-12 NOTE — Op Note (Addendum)
 06/12/2023  6:39 PM   PATIENT: Andre Ramirez  43 y.o. male  MRN: [id number]   PRE-OPERATIVE DIAGNOSIS:   Closed fracture of anteromedial and anterolateral left distal tibia plafond with disruption of syndesmosis   POST-OPERATIVE DIAGNOSIS:   Same   PROCEDURE: 1] ORIF left distal tibia pilon without fixation of fibula 2] ORIF left ankle syndesmosis 3] Left ankle arthroscopic assisted debridement   SURGEON:  Ali Ink, MD   ASSISTANT: None   ANESTHESIA: General, regional   EBL: Minimal   TOURNIQUET:    Total Tourniquet Time Documented: Thigh (Left) - 60 minutes Total: Thigh (Left) - 60 minutes    COMPLICATIONS: None apparent   DISPOSITION: Extubated, awake and stable to recovery.   INDICATION FOR PROCEDURE: The patient presented with above diagnosis.  We discussed the diagnosis, alternative treatment options, risks and benefits of the above surgical intervention, as well as alternative non-operative treatments. All questions/concerns were addressed and the patient/family demonstrated appropriate understanding of the diagnosis, the procedure, the postoperative course, and overall prognosis. The patient wished to proceed with surgical intervention and signed an informed surgical consent as such, in each others presence prior to surgery.   PROCEDURE IN DETAIL: After preoperative consent was obtained and the correct operative site was identified, the patient was brought to the operating room supine on stretcher and transferred onto operating table. General anesthesia was induced. Preoperative antibiotics were administered. Surgical timeout was taken. The patient was then positioned supine with an ipsilateral hip bump. The operative lower extremity was prepped and draped in standard sterile fashion with a tourniquet around the thigh. The extremity was exsanguinated and the tourniquet was inflated to 275 mmHg.   We began by insufflating the ankle joint thru  anteromedial approach. The anteromedial portal was carefully established medial to the tibialis anterior tendon. The arthroscopic trochar with blunt was inserted and then camera placed. There was excellent visualization of the joint and routine diagnostic ankle arthroscopy was performed. Of note, there was mild synovitis throughout the joint noted. Mild chondral changes in the tibiotalar joint surfaces. No loose bodies were encountered and no anterior ankle impingement was identified on max dorsiflexion. The deltoid and syndesmosis ligaments were stressed and noted to be stable. Arthroscopic assisted debridement of the ankle joint was performed.   We then made a direct medial ankle approach and extended this proximally in anticipation of implanting a hook plate. Dissection was carried down to the level of the medial pilon fragment. A dental pick and freer elevator were used to reduce the medial malleolar fragment. Of note, there was extensive comminution of this fragment into multiple segments, all of which had very poor bone quality. An Arthrex medial distal tibia hook plate was utilized to fix the reduced medial malleolar fragment and the tines were carefully inserted into the distal tip of the malleolus. The plate was oriented to best capture the major fragments of the medial comminution. We placed a non-locking screw in the hook plate and subsequently implanted another locking screw distally to further secure the plate.   The anterolateral Chap[place] fragment was deemed too small and comminuted to receive internal fixation.  A manual external rotation stress radiograph was obtained and demonstrated widening of the ankle mortise. Given this intraoperative finding as well as fracture pattern, it was decided to reduce and fix the syndesmosis. Therefore a suture fixation system (TightRope device) was implanted through a fibula plate in cannulated fashion to fix the syndesmosis. Anchor/button position was verified  along anteromedial tibial  cortex by fluoroscopy. A repeat stress radiograph showed complete stability of the ankle mortise to testing.   The surgical sites were thoroughly irrigated. The tourniquet was deflated and hemostasis achieved. Betadine  and vancomycin  powder were applied. The deep layers were closed using 2-0 vicryl. The skin was closed without tension.    The leg was cleaned with saline and sterile dressings with gauze were applied. A well padded bulky short leg splint was applied. The patient was awakened from anesthesia and transported to the recovery room in stable condition.    FOLLOW UP PLAN: -transfer to PACU, then home -strict NWB operative extremity, maximum elevation -maintain short leg splint until follow up -DVT ppx: Aspirin 81 mg twice daily while NWB -follow up as outpatient within 7-10 days for wound check with exchange of short leg splint to short leg cast -sutures out in 2-3 weeks in outpatient office   RADIOGRAPHS: AP, lateral, oblique and stress radiographs of the left ankle were obtained intraoperatively. These showed interval reduction and fixation of the fractures. Manual stress radiographs were taken and the joints were noted to be stable following fixation. All hardware is appropriately positioned and of the appropriate lengths. No other acute injuries are noted.   Ali Ink [facility]

## 2023-06-13 ENCOUNTER — Encounter (HOSPITAL_BASED_OUTPATIENT_CLINIC_OR_DEPARTMENT_OTHER): Payer: Self-pay | Admitting: Orthopaedic Surgery

## 2023-06-14 NOTE — Anesthesia Postprocedure Evaluation (Signed)
Anesthesia Post Note  Patient: Andre Ramirez  Procedure(s) Performed: OPEN REDUCTION INTERNAL FIXATION (ORIF) MEDIAL MALLEOLUS ANKLE FRACTURE (Left: Ankle) SYNDESMOSIS REPAIR (Left: Ankle) ANKLE ARTHROSCOPY WITH NANOSCOPE (Left: Ankle)     Patient location during evaluation: PACU Anesthesia Type: Regional and General Level of consciousness: awake and alert Pain management: pain level controlled Vital Signs Assessment: post-procedure vital signs reviewed and stable Respiratory status: spontaneous breathing, nonlabored ventilation and respiratory function stable Cardiovascular status: blood pressure returned to baseline and stable Postop Assessment: no apparent nausea or vomiting Anesthetic complications: no   No notable events documented.  Last Vitals:  Vitals:   06/12/23 1339 06/12/23 1345  BP: 110/88 136/86  Pulse: 97 100  Resp: 13 16  Temp:  (!) 36.4 C  SpO2: 96% 97%    Last Pain:  Vitals:   06/12/23 1345  TempSrc:   PainSc: 2                  [name]

## 2023-07-07 ENCOUNTER — Encounter: Payer: Self-pay | Admitting: Family Medicine

## 2023-07-07 ENCOUNTER — Telehealth: Admitting: Family Medicine

## 2023-07-07 ENCOUNTER — Other Ambulatory Visit (HOSPITAL_COMMUNITY): Payer: Self-pay

## 2023-07-07 DIAGNOSIS — J111 Influenza due to unidentified influenza virus with other respiratory manifestations: Secondary | ICD-10-CM

## 2023-07-07 MED ORDER — OSELTAMIVIR PHOSPHATE 75 MG PO CAPS
75.0000 mg | ORAL_CAPSULE | Freq: Two times a day (BID) | ORAL | 0 refills | Status: AC
  Filled 2023-07-07: qty 10, 5d supply, fill #0

## 2023-07-07 NOTE — Telephone Encounter (Signed)
 Nurses As for taking Tamiflu as a preventative that is not recommended-typically only in situations where a person has significant immunodeficiency issues do we recommend preventative Tamiflu Otherwise it is more of a matter of utilizing Tamiflu if symptoms start up such as body aches headache fever chills Then in that situation it would be twice daily for 5 days  If Apolinar Junes is interested with having Tamiflu on hand for this week and we can send in the prescription with instruction to put prescription on file-just let me know  Tamiflu 75 mg 1 twice daily for 5 days [facility] prescription on file prescription valid through March 19  Obviously if having a severe case of the flu fever chills cough difficulty breathing etc. it would be wise to be seen by a provider regardless of having Tamiflu at home  Thanks-Dr. Lorin Picket

## 2023-07-07 NOTE — Progress Notes (Signed)

## 2023-07-12 ENCOUNTER — Telehealth: Payer: Self-pay | Admitting: *Deleted

## 2023-07-12 NOTE — Telephone Encounter (Signed)
 I imagine 3 months would be January February or March? It would be wise to make sure patient is aware of this and perhaps go ahead and set him up at the end of the month?  This could be either with myself, Eber Jones or [place] depending on availability Also I notified the patient currently hopefully if he has not already worked hard at losing weight he certainly should now Thanks

## 2023-07-12 NOTE — Telephone Encounter (Signed)
 Left message to return call

## 2023-07-12 NOTE — Telephone Encounter (Signed)
Patient notified and scheduled follow up office visit. 

## 2023-07-12 NOTE — Telephone Encounter (Signed)
 Patient's new insurance approved [facility] 05/24/23 for 3 months and is up for reevaluation- patient needs office visit with current weight in person that shows he has lost 5% of body weight since January for them to continue coverage

## 2023-07-25 ENCOUNTER — Encounter: Payer: Self-pay | Admitting: Family Medicine

## 2023-07-25 ENCOUNTER — Ambulatory Visit: Admitting: Family Medicine

## 2023-07-25 VITALS — BP 122/72 | HR 97 | Temp 97.7°F | Ht 70.0 in | Wt 190.0 lb

## 2023-07-25 DIAGNOSIS — E663 Overweight: Secondary | ICD-10-CM

## 2023-07-25 DIAGNOSIS — E785 Hyperlipidemia, unspecified: Secondary | ICD-10-CM

## 2023-07-25 MED ORDER — WEGOVY 1 MG/0.5ML ~~LOC~~ SOAJ: 1.0000 mg | SUBCUTANEOUS | 6 refills | Status: DC

## 2023-07-25 MED ORDER — LISINOPRIL 20 MG PO TABS: 20.0000 mg | ORAL_TABLET | Freq: Every day | ORAL | 1 refills | Status: DC

## 2023-07-25 NOTE — Progress Notes (Signed)
   Subjective:    Patient ID: Andre Ramirez, male    DOB: 07-May-1980, 43 y.o.   MRN: [id number]  HPI Wegovy follow up  Patient has done very well He originally was 225-230-hang down to 200 pounds Now down to 190 pounds Doing well on the medicine Patient does have some mild nausea with the medicine therefore would not tolerate the higher dose Recommend that he continue with the current dose He is watching his diet staying physically active avoiding sugary drinks he is fitting in some activity Recently fractured an ankle had to go through surgery therefore has been unable to work out the way he would like to  Patient for blood pressure check up.  The patient does have hypertension.   Patient relates dietary measures try to minimize salt The importance of healthy diet and activity were discussed Patient relates compliance  Patient here for follow-up regarding cholesterol.    Patient relates t maintaining healthy dietImportance of dietary measures discussed Regular lab work regarding lipid and liver was checked and if needing additional labs was appropriately ordered  Review of Systems     Objective:   Physical Exam General-in no acute distress Eyes-no discharge Lungs-respiratory rate normal, CTA CV-no murmurs,RRR Extremities skin warm dry no edema Neuro grossly normal Behavior normal, alert        Assessment & Plan:  1. Overweight (BMI 25.0-29.9) (Primary) Patient has done very well losing weight with Reginal Lutes Tolerating the 1 mg Patient does not feel he would tolerate 2 mg He continues to lose weight He will continue healthy diet regular activity He will follow-up here in 6 months Lab work in the near future we will send him notification regarding the results  2. Hyperlipidemia, unspecified hyperlipidemia type Healthy diet regular activity follow-up labs as planned  Blood pressure very good control continue current measures at the current dosing for now

## 2023-11-13 ENCOUNTER — Ambulatory Visit: Payer: 59 | Admitting: Family Medicine

## 2023-12-27 ENCOUNTER — Encounter: Payer: Self-pay | Admitting: Family Medicine

## 2023-12-27 DIAGNOSIS — Z79899 Other long term (current) drug therapy: Secondary | ICD-10-CM

## 2023-12-27 DIAGNOSIS — R7989 Other specified abnormal findings of blood chemistry: Secondary | ICD-10-CM

## 2023-12-27 DIAGNOSIS — E785 Hyperlipidemia, unspecified: Secondary | ICD-10-CM

## 2023-12-27 DIAGNOSIS — I10 Essential (primary) hypertension: Secondary | ICD-10-CM

## 2023-12-27 NOTE — Telephone Encounter (Signed)
 Nurses Please order lipid, liver, metabolic 7, urine ACR,  Diagnosis hyperlipidemia high risk med hypertension and wellness  As for the other 2 tests please pass along the following message  Hi Penne  In regards to the additional test-we are always willing to try to work with folks in regards to checking test but It is important for you to have some additional information 1.  We can order testosterone if you are interested in doing so but more than likely the cost will be out-of-pocket since you do not have medical condition to associate with the lab as far as diagnosis for insurance purposes  #2 blood type is not checked on a regular basis by any doctor because it is relatively useless information from a doctor point of view.  We do not utilize blood type in any of our decision-making.  There is some unusual literature on social media regarding diets in regards to a person's blood type but there is not a scientific basis for this.  The only time blood type  comes into play is when a person is having surgery or an emergency situation such as a ER and that is when the ER or hospital do what is referred to is a type and cross If you choose to have us  order blood tests to check your blood type the cost out-of-pocket could be several hyndred dollars a inexpensive way to find out your blood type is to donate blood-by doing so [facility] or whoever you donate to would give you feedback regarding your blood type  So please give us  some feedback regarding if you really want to go down this route

## 2024-01-03 ENCOUNTER — Encounter: Admitting: Family Medicine

## 2024-01-13 LAB — MICROALBUMIN / CREATININE URINE RATIO
Creatinine, Urine: 20.4 mg/dL
Microalb/Creat Ratio: <15 mg/g{creat} (ref 0–29)
Microalbumin, Urine: <3 ug/mL

## 2024-01-13 LAB — BASIC METABOLIC PANEL WITH GFR
BUN/Creatinine Ratio: 12 (ref 9–20)
BUN: 14 mg/dL (ref 6–24)
CO2: 21 mmol/L (ref 20–29)
Calcium: 9.4 mg/dL (ref 8.7–10.2)
Chloride: 100 mmol/L (ref 96–106)
Creatinine, Ser: 1.18 mg/dL (ref 0.76–1.27)
Glucose: 96 mg/dL (ref 70–99)
Potassium: 4 mmol/L (ref 3.5–5.2)
Sodium: 136 mmol/L (ref 134–144)
eGFR: 79 mL/min/1.73 (ref >59)

## 2024-01-13 LAB — HEPATIC FUNCTION PANEL
ALT: 32 IU/L (ref 0–44)
AST: 23 IU/L (ref 0–40)
Albumin: 4.7 g/dL (ref 4.1–5.1)
Alkaline Phosphatase: 85 IU/L (ref 44–121)
Bilirubin Total: 0.8 mg/dL (ref 0.0–1.2)
Bilirubin, Direct: 0.26 mg/dL (ref 0.00–0.40)
Total Protein: 6.6 g/dL (ref 6.0–8.5)

## 2024-01-13 LAB — LIPID PANEL
Chol/HDL Ratio: 3.1 ratio (ref 0.0–5.0)
Cholesterol, Total: 181 mg/dL (ref 100–199)
HDL: 58 mg/dL (ref >39)
LDL Chol Calc (NIH): 114 mg/dL — ABNORMAL HIGH (ref 0–99)
Triglycerides: 46 mg/dL (ref 0–149)
VLDL Cholesterol Cal: 9 mg/dL (ref 5–40)

## 2024-01-15 ENCOUNTER — Ambulatory Visit: Payer: Self-pay | Admitting: Family Medicine

## 2024-01-24 ENCOUNTER — Other Ambulatory Visit: Payer: Self-pay | Admitting: Family Medicine

## 2024-01-24 ENCOUNTER — Encounter: Payer: Self-pay | Admitting: Family Medicine

## 2024-02-15 ENCOUNTER — Other Ambulatory Visit: Payer: Self-pay | Admitting: Family Medicine

## 2024-04-11 ENCOUNTER — Encounter: Payer: Self-pay | Admitting: Family Medicine

## 2024-04-11 ENCOUNTER — Ambulatory Visit: Admitting: Family Medicine

## 2024-04-11 ENCOUNTER — Other Ambulatory Visit: Payer: Self-pay

## 2024-04-11 VITALS — BP 128/70 | HR 75 | Temp 97.5°F | Ht 70.0 in | Wt 182.0 lb

## 2024-04-11 DIAGNOSIS — Z0000 Encounter for general adult medical examination without abnormal findings: Secondary | ICD-10-CM

## 2024-04-11 DIAGNOSIS — E663 Overweight: Secondary | ICD-10-CM

## 2024-04-11 DIAGNOSIS — I10 Essential (primary) hypertension: Secondary | ICD-10-CM

## 2024-04-11 DIAGNOSIS — Z23 Encounter for immunization: Secondary | ICD-10-CM

## 2024-04-11 DIAGNOSIS — E785 Hyperlipidemia, unspecified: Secondary | ICD-10-CM

## 2024-04-11 NOTE — Progress Notes (Signed)
° °  Subjective:    Patient ID: Andre Ramirez, male    DOB: 08-03-1980, 43 y.o.   MRN: [id number]  HPI The patient comes in today for a wellness visit.  Patient relates that Wegovy  has helped him lose weight he is taking the medication tolerating well  A review of their health history was completed.  A review of medications was also completed.  Any needed refills; no   Eating habits: no  Falls/  MVA accidents in past few months: no  Regular exercise: weights cardio, running  Specialist pt sees on regular basis: ortho - l ankle  Preventative health issues were discussed.   Additional concerns: no   The 10-year ASCVD risk score (Arnett DK, et al., 2019) is: 1.3%   Values used to calculate the score:     Age: 38 years     Clinically relevant sex: Male     Is Non-Hispanic African American: No     Diabetic: No     Tobacco smoker: No     Systolic Blood Pressure: 128 mmHg     Is BP treated: Yes     HDL Cholesterol: 58 mg/dL     Total Cholesterol: 181 mg/dL  Review of Systems     Objective:   Physical Exam General-in no acute distress Eyes-no discharge Lungs-respiratory rate normal, CTA CV-no murmurs,RRR Extremities skin warm dry no edema Neuro grossly normal Behavior normal, alert Prostate exam not indicated       Assessment & Plan:   1. Primary hypertension Blood pressure good control check blood pressure sitting standing recommend continuing current dose follow-up 6 months  2. Hyperlipidemia, unspecified hyperlipidemia type Healthy diet regular activity statin not indicated  3. Well adult exam (Primary) Adult wellness-complete.wellness physical was conducted today. Importance of diet and exercise were discussed in detail.  Importance of stress reduction and healthy living were discussed.  In addition to this a discussion regarding safety was also covered.  We also reviewed over immunizations and gave recommendations regarding current immunization needed  for age.   In addition to this additional areas were also touched on including: Preventative health exams needed:  Colonoscopy not indicated until 45  Patient was advised yearly wellness exam   4. Overweight (BMI 25.0-29.9) Doing a good job losing weight on Wegovy  healthy diet

## 2024-04-23 ENCOUNTER — Other Ambulatory Visit (HOSPITAL_COMMUNITY): Payer: Self-pay

## 2024-04-23 ENCOUNTER — Telehealth: Payer: Self-pay | Admitting: Pharmacy Technician

## 2024-04-23 NOTE — Telephone Encounter (Signed)
 Pharmacy Patient Advocate Encounter   Received notification from Onbase that prior authorization for Wegovy  1MG /0.5ML auto-injectors is due for renewal.   Insurance verification completed.   The patient is insured through [facility].  Action: PA required; PA started via CoverMyMeds. KEY [id number] . Waiting for clinical questions to populate.

## 2024-04-23 NOTE — Telephone Encounter (Signed)
 Pharmacy Patient Advocate Encounter  Received notification from [employer] that Prior Authorization for Wegovy  1MG /0.5ML auto-injectors has been CLOSED.   Current PA on file does not expire until 05/23/2024. No additional PA needed at this time.  PA #/Case ID/Reference #: [id number]

## 2024-05-31 ENCOUNTER — Other Ambulatory Visit (HOSPITAL_COMMUNITY): Payer: Self-pay
